# Patient Record
Sex: Male | Born: 1970 | Race: Black or African American | Hispanic: No | Marital: Married | Smoking: Never smoker
Health system: Southern US, Community
[De-identification: ages and names within clinical notes are randomized; demographics above are authoritative.]

## PROBLEM LIST (undated history)

## (undated) DIAGNOSIS — R053 Chronic cough: Secondary | ICD-10-CM

## (undated) DIAGNOSIS — K219 Gastro-esophageal reflux disease without esophagitis: Secondary | ICD-10-CM

## (undated) DIAGNOSIS — R05 Cough: Secondary | ICD-10-CM

## (undated) DIAGNOSIS — Z91018 Allergy to other foods: Secondary | ICD-10-CM

## (undated) DIAGNOSIS — J45909 Unspecified asthma, uncomplicated: Secondary | ICD-10-CM

## (undated) HISTORY — PX: HERNIA REPAIR: SHX51

## (undated) HISTORY — DX: Allergy to other foods: Z91.018

## (undated) HISTORY — DX: Gastro-esophageal reflux disease without esophagitis: K21.9

---

## 2008-07-19 ENCOUNTER — Emergency Department (HOSPITAL_BASED_OUTPATIENT_CLINIC_OR_DEPARTMENT_OTHER): Admission: EM | Admit: 2008-07-19 | Discharge: 2008-07-19 | Payer: Self-pay | Admitting: Emergency Medicine

## 2008-08-06 ENCOUNTER — Emergency Department (HOSPITAL_BASED_OUTPATIENT_CLINIC_OR_DEPARTMENT_OTHER): Admission: EM | Admit: 2008-08-06 | Discharge: 2008-08-06 | Payer: Self-pay | Admitting: Emergency Medicine

## 2017-03-02 ENCOUNTER — Emergency Department (HOSPITAL_BASED_OUTPATIENT_CLINIC_OR_DEPARTMENT_OTHER)
Admission: EM | Admit: 2017-03-02 | Discharge: 2017-03-03 | Disposition: A | Discharge status: Home / Self Care | Payer: 59 | Attending: Emergency Medicine | Admitting: Emergency Medicine

## 2017-03-02 ENCOUNTER — Encounter (HOSPITAL_BASED_OUTPATIENT_CLINIC_OR_DEPARTMENT_OTHER): Payer: Self-pay | Admitting: Emergency Medicine

## 2017-03-02 ENCOUNTER — Emergency Department (HOSPITAL_BASED_OUTPATIENT_CLINIC_OR_DEPARTMENT_OTHER): Payer: 59

## 2017-03-02 DIAGNOSIS — R059 Cough, unspecified: Secondary | ICD-10-CM

## 2017-03-02 DIAGNOSIS — R0602 Shortness of breath: Secondary | ICD-10-CM | POA: Insufficient documentation

## 2017-03-02 DIAGNOSIS — J4541 Moderate persistent asthma with (acute) exacerbation: Secondary | ICD-10-CM | POA: Diagnosis not present

## 2017-03-02 DIAGNOSIS — Z79899 Other long term (current) drug therapy: Secondary | ICD-10-CM | POA: Diagnosis not present

## 2017-03-02 DIAGNOSIS — R05 Cough: Secondary | ICD-10-CM | POA: Diagnosis present

## 2017-03-02 HISTORY — DX: Unspecified asthma, uncomplicated: J45.909

## 2017-03-02 HISTORY — DX: Chronic cough: R05.3

## 2017-03-02 HISTORY — DX: Cough: R05

## 2017-03-02 LAB — CBC WITH DIFFERENTIAL/PLATELET
BASOS PCT: 0 %
Basophils Absolute: 0 10*3/uL (ref 0.0–0.1)
Eosinophils Absolute: 0.8 10*3/uL — ABNORMAL HIGH (ref 0.0–0.7)
Eosinophils Relative: 10 %
HCT: 43 % (ref 39.0–52.0)
Hemoglobin: 14.5 g/dL (ref 13.0–17.0)
Lymphocytes Relative: 28 %
Lymphs Abs: 2.4 10*3/uL (ref 0.7–4.0)
MCH: 30.7 pg (ref 26.0–34.0)
MCHC: 33.7 g/dL (ref 30.0–36.0)
MCV: 91.1 fL (ref 78.0–100.0)
Monocytes Absolute: 0.8 10*3/uL (ref 0.1–1.0)
Monocytes Relative: 10 %
NEUTROS ABS: 4.4 10*3/uL (ref 1.7–7.7)
NEUTROS PCT: 52 %
Platelets: 184 10*3/uL (ref 150–400)
RBC: 4.72 MIL/uL (ref 4.22–5.81)
RDW: 13.6 % (ref 11.5–15.5)
WBC: 8.5 10*3/uL (ref 4.0–10.5)

## 2017-03-02 LAB — BASIC METABOLIC PANEL
ANION GAP: 10 (ref 5–15)
BUN: 15 mg/dL (ref 6–20)
CALCIUM: 9.4 mg/dL (ref 8.9–10.3)
CO2: 25 mmol/L (ref 22–32)
Chloride: 105 mmol/L (ref 101–111)
Creatinine, Ser: 1.04 mg/dL (ref 0.61–1.24)
Glucose, Bld: 117 mg/dL — ABNORMAL HIGH (ref 65–99)
POTASSIUM: 3.7 mmol/L (ref 3.5–5.1)
Sodium: 140 mmol/L (ref 135–145)

## 2017-03-02 LAB — TROPONIN I

## 2017-03-02 MED ORDER — IPRATROPIUM-ALBUTEROL 0.5-2.5 (3) MG/3ML IN SOLN
3.0000 mL | Freq: Four times a day (QID) | RESPIRATORY_TRACT | Status: DC
  Administered 2017-03-02: 3 mL via RESPIRATORY_TRACT
  Filled 2017-03-02: qty 3

## 2017-03-02 NOTE — ED Triage Notes (Addendum)
c/o cough, SOB x 2 months-states he has been seen urgent care for same x 4-pt with continuous cough in triage

## 2017-03-02 NOTE — ED Provider Notes (Signed)
[facility] Provider Note   CSN: [id number] Arrival date & time: 03/02/17  2216  By signing my name below, I, Modena Jansky, attest that this documentation has been prepared under the direction and in the presence of Geoffery Lyons, MD. Electronically Signed: Modena Jansky, Scribe. 03/02/2017. 11:00 PM.  History   Chief Complaint Chief Complaint  Patient presents with  . Cough   The history is provided by the patient. No language interpreter was used.  Cough  This is a new problem. The current episode started more than 1 week ago. The problem occurs constantly. The problem has been gradually worsening. The cough is productive of sputum. There has been no fever. Associated symptoms include shortness of breath. Treatments tried: inhaler. The treatment provided no relief. He is not a smoker. His past medical history is significant for bronchitis. His past medical history does not include asthma.   HPI Comments: Ricky Gutierrez is a 46 y.o. male who presents to the Emergency Department complaining of intermittent moderate productive cough that started about 4 months ago. He reports that today is the 4th evaluation for the same complaint. He was diagnosed with bronchitis with this complaint. Prior to this episode, he was only on allergy medication but has been prescribed albuterol inhaler, antibiotics, and prednisone in the past 4 months. He had a coughing fit 3 weeks ago when he had a brief episode of LOC. He came to the ED today due to worsening progression. His cough is unrelieved by his inhaler. He reports associated intermittent lightheadedness and SOB with coughing spells. Denies any prior hx of asthma, hx of smoking, or other complaints at this time.  Past Medical History:  Diagnosis Date  . Chronic cough   . Reactive airway disease     There are no active problems to display for this patient.   Past Surgical History:  Procedure Laterality Date  . HERNIA REPAIR          Home Medications    Prior to Admission medications   Medication Sig Start Date End Date Taking? Authorizing Provider  Albuterol Sulfate (PROAIR HFA IN) Inhale into the lungs.   Yes Historical Provider, MD  AMLODIPINE BESYLATE PO Take by mouth.   Yes Historical Provider, MD  BENZONATATE PO Take by mouth.   Yes Historical Provider, MD  Fexofenadine HCl (ALLEGRA PO) Take by mouth.   Yes Historical Provider, MD  Montelukast Sodium (SINGULAIR PO) Take by mouth.   Yes Historical Provider, MD  OMEPRAZOLE PO Take by mouth.   Yes Historical Provider, MD  triamcinolone (NASACORT) 55 MCG/ACT AERO nasal inhaler Place 2 sprays into the nose daily.   Yes Historical Provider, MD    Family History No family history on file.  Social History Social History  Substance Use Topics  . Smoking status: Never Smoker  . Smokeless tobacco: Never Used  . Alcohol use No     Allergies   Fish allergy   Review of Systems Review of Systems  Respiratory: Positive for cough and shortness of breath.   Neurological: Positive for syncope (Brief) and light-headedness.  All other systems reviewed and are negative.    Physical Exam Updated Vital Signs BP 128/86 (BP Location: Left Arm)   Pulse 98   Temp 99.2 F (37.3 C) (Oral)   Resp 20   Ht  (1.803 m)   Wt (!) 305 lb (138.3 kg)   SpO2 99%   BMI 42.54 kg/m   Physical Exam  Constitutional: He  is oriented to person, place, and time. He appears well-developed and well-nourished.  HENT:  Head: Normocephalic and atraumatic.  Eyes: EOM are normal.  Neck: Normal range of motion.  Cardiovascular: Normal rate, regular rhythm, normal heart sounds and intact distal pulses.   Pulmonary/Chest: Effort normal. No respiratory distress. He has wheezes. He has no rales.  Expiratory rhonchi bilaterally.   Abdominal: Soft. He exhibits no distension. There is no tenderness.  Musculoskeletal: Normal range of motion.  Neurological: He is alert and  oriented to person, place, and time.  Skin: Skin is warm and dry.  Psychiatric: He has a normal mood and affect. Judgment normal.  Nursing note and vitals reviewed.    ED Treatments / Results  DIAGNOSTIC STUDIES: Oxygen Saturation is 99% on RA, normal by my interpretation.    COORDINATION OF CARE: 11:05 PM- Pt advised of plan for treatment and pt agrees.  Labs (all labs ordered are listed, but only abnormal results are displayed) Labs Reviewed  BASIC METABOLIC PANEL - Abnormal; Notable for the following:       Result Value   Glucose, Bld 117 (*)    All other components within normal limits  CBC WITH DIFFERENTIAL/PLATELET - Abnormal; Notable for the following:    Eosinophils Absolute 0.8 (*)    All other components within normal limits  TROPONIN I  BRAIN NATRIURETIC PEPTIDE    EKG  EKG Interpretation None       Radiology Dg Chest 2 View  Result Date: 03/02/2017 CLINICAL DATA:  Cough and dyspnea for 1 month, worsened tonight. EXAM: CHEST  2 VIEW COMPARISON:  02/09/2017 FINDINGS: The lungs are clear. The pulmonary vasculature is normal. Heart size is normal. Hilar and mediastinal contours are unremarkable. There is no pleural effusion. IMPRESSION: No active cardiopulmonary disease. Electronically Signed   By: Ellery Plunk M.D.   On: 03/02/2017 23:47    Procedures Procedures (including critical care time)  Medications Ordered in ED Medications  ipratropium-albuterol (DUONEB) 0.5-2.5 (3) MG/3ML nebulizer solution 3 mL (3 mLs Nebulization Given 03/02/17 2336)     Initial Impression / Assessment and Plan / ED Course  I have reviewed the triage vital signs and the nursing notes.  Pertinent labs & imaging results that were available during my care of the patient were reviewed by me and considered in my medical decision making (see chart for details).  Patient is a 46 year old male with past medical history of what sounds like newly diagnosed asthma. He has had  persistent cough for the past 2 months. He has been in urgent care on multiple occasions, however medications he has been given have not been effective.  He is wheezing somewhat on presentation. He was given an hour-long treatment along with a dose of an IV steroid. His workup today reveals no evidence for a cardiac etiology. His EKG is essentially normal and troponin is negative. BNP is normal and there is no evidence for heart failure. This appears to be some sort of reactive airway disease. He will be given another round of steroids and is to follow-up with pulmonology.  Final Clinical Impressions(s) / ED Diagnoses   Final diagnoses:  None    New Prescriptions New Prescriptions   No medications on file   I personally performed the services described in this documentation, which was scribed in my presence. The recorded information has been reviewed and is accurate.        Geoffery Lyons, MD 03/03/17 [phone]

## 2017-03-03 LAB — BRAIN NATRIURETIC PEPTIDE: B NATRIURETIC PEPTIDE 5: 7.7 pg/mL (ref 0.0–100.0)

## 2017-03-03 MED ORDER — METHYLPREDNISOLONE SODIUM SUCC 125 MG IJ SOLR
125.0000 mg | Freq: Once | INTRAMUSCULAR | Status: AC
  Administered 2017-03-03: 125 mg via INTRAVENOUS
  Filled 2017-03-03: qty 2

## 2017-03-03 MED ORDER — HYDROCODONE-ACETAMINOPHEN 7.5-325 MG/15ML PO SOLN: 10.0000 mL | Freq: Four times a day (QID) | ORAL | 0 refills | Status: DC | PRN

## 2017-03-03 MED ORDER — HYDROCODONE-HOMATROPINE 5-1.5 MG/5ML PO SYRP: 5.0000 mL | ORAL_SOLUTION | Freq: Four times a day (QID) | ORAL | 0 refills | Status: DC | PRN

## 2017-03-03 MED ORDER — PREDNISONE 10 MG PO TABS: 20.0000 mg | ORAL_TABLET | Freq: Two times a day (BID) | ORAL | 0 refills | Status: DC

## 2017-03-03 MED ORDER — ALBUTEROL (5 MG/ML) CONTINUOUS INHALATION SOLN
15.0000 mg/h | INHALATION_SOLUTION | RESPIRATORY_TRACT | Status: DC
  Administered 2017-03-03: 15 mg/h via RESPIRATORY_TRACT

## 2017-03-03 MED ORDER — IPRATROPIUM BROMIDE 0.02 % IN SOLN
0.5000 mg | Freq: Once | RESPIRATORY_TRACT | Status: AC
  Administered 2017-03-03: 0.5 mg via RESPIRATORY_TRACT
  Filled 2017-03-03: qty 2.5

## 2017-03-03 MED ORDER — HYDROCODONE-ACETAMINOPHEN 7.5-325 MG/15ML PO SOLN
15.0000 mL | Freq: Once | ORAL | Status: AC
  Administered 2017-03-03: 15 mL via ORAL
  Filled 2017-03-03: qty 15

## 2017-03-03 NOTE — ED Notes (Signed)
Pt verbalizes understanding of d/c instructions and denies any further needs at this time. 

## 2017-03-03 NOTE — Discharge Instructions (Signed)
Prednisone as prescribed.  Follow-up with pulmonology in the next week, the number for the limp or pulmonology clinic has been provided in this discharge summary for you to call and make these arrangements.

## 2017-03-08 ENCOUNTER — Ambulatory Visit (INDEPENDENT_AMBULATORY_CARE_PROVIDER_SITE_OTHER): Payer: 59 | Admitting: Internal Medicine

## 2017-03-08 ENCOUNTER — Encounter: Payer: Self-pay | Admitting: Internal Medicine

## 2017-03-08 VITALS — BP 142/86 | HR 70 | Ht 71.0 in | Wt 310.0 lb

## 2017-03-08 DIAGNOSIS — J45991 Cough variant asthma: Secondary | ICD-10-CM

## 2017-03-08 MED ORDER — FAMOTIDINE 20 MG PO TABS: ORAL_TABLET | ORAL | 2 refills | Status: DC

## 2017-03-08 NOTE — Progress Notes (Signed)
Subjective:     Patient ID: Ricky Gutierrez, male   DOB: 10-07-1971,   MRN: [id number]  HPI  24  yobm never smoker grew up [place] good athlete no respiratory problems until around 2013 rhinitis then around 2016 with cough/ sob esp drinking cold liquids or sleeping R side much worse starting March 2018 > UC x 3 > much better while on prednisone than when finished so referred to pulmonary clinic 03/08/2017 by [facility]   03/08/2017 1st [place] [facility] office visit/ Gloria Ricardo  On singulair  Chief Complaint  Patient presents with  . Pulmonary Consult    Referred by Dr. Geoffery Lyons.  Pt c/o SOB for the past 2 yrs. He c/o cough for 2 months- occ prod with gold sputum.  He states he gets SOB with and without exertion. He feels worse if he lies on his right side. He states he wakes up in the night coughing. He has also noticed that cold liquids make his cough worse.   despite prednisone/ abx and feeling "better" still has sensation of something stuck in throat but very little actual mucus  production pred works much  Better than albuterol  - symptoms worse at hs / was waking him up but not now on [facility]codan [facility] with overt Hb but no active sinus symptoms  No better with tessilon or delsym     No obvious day to day or daytime variability or assoc excess/ purulent sputum or mucus plugs or hemoptysis or cp or chest tightness, subjective wheeze . No unusual exp hx or h/o childhood pna/ asthma or knowledge of premature birth.  Sleeping ok without nocturnal  or early am exacerbation  of respiratory  c/o's or need for noct saba. Also denies any obvious fluctuation of symptoms with weather or environmental changes or other aggravating or alleviating factors except as outlined above   Current Medications, Allergies, Complete Past Medical History, Past Surgical History, Family History, and Social History were reviewed in [employer] record.  ROS  The following are not active  complaints unless bolded sore throat, dysphagia, dental problems, itching, sneezing,  nasal congestion or excess/ purulent secretions, ear ache,   fever, chills, sweats, unintended wt loss, classically pleuritic or exertional cp,  orthopnea pnd or leg swelling, presyncope, palpitations, abdominal pain, anorexia, nausea, vomiting, diarrhea  or change in bowel or bladder habits, change in stools or urine, dysuria,hematuria,  rash, arthralgias, visual complaints, headache, numbness, weakness or ataxia or problems with walking or coordination,  change in mood/affect or memory.            Review of Systems     Objective:   Physical Exam    amb obese bm nad/ vigorous throat clearing  Wt Readings from Last 3 Encounters:  03/08/17 (!) 310 lb (140.6 kg)  03/02/17 (!) 305 lb (138.3 kg)    Vital signs reviewed  - Note on arrival 02 sats  97% on RA     HEENT: nl dentition,  and oropharynx. Wax in both external ear canals without cough reflex - mild  bilateral non-specific turbinate edema  With very min  mucopurulent mucus   NECK :  without JVD/Nodes/TM/ nl carotid upstrokes bilaterally   LUNGS: no acc muscle use,  Nl contour chest which is clear to A and P bilaterally without cough on insp or exp maneuvers   CV:  RRR  no s3 or murmur or increase in P2, and no edema   ABD:  soft and nontender with nl inspiratory excursion in the supine position. No bruits or organomegaly appreciated, bowel sounds nl  MS:  Nl gait/ ext warm without deformities, calf tenderness, cyanosis or clubbing No obvious joint restrictions   SKIN: warm and dry without lesions    NEURO:  alert, approp, nl sensorium with  no motor or cerebellar deficits apparent.      I personally reviewed images and agree with radiology impression as follows:  CXR:   03/02/17 No active cardiopulmonary disease.  Labs reviewed:  Cbc 03/02/17 with Eos  0.8  Assessment:

## 2017-03-08 NOTE — Patient Instructions (Addendum)
Please see patient coordinator before you leave today  to schedule sinus CT asap   Omeprazole  40 mg   Take  30-60 min before first meal of the day and Pepcid (famotidine)  20 mg one @  bedtime until return to office - this is the best way to tell whether stomach acid is contributing to your problem.    For drainage / throat tickle try take CHLORPHENIRAMINE  4 mg - take one every 4 hours as needed - available over the counter- may cause drowsiness so start with just a bedtime dose or two and see how you tolerate it before trying in daytime    GERD (REFLUX)  is an extremely common cause of respiratory symptoms just like yours , many times with no obvious heartburn at all.    It can be treated with medication, but also with lifestyle changes including elevation of the head of your bed (ideally with 6 inch  bed blocks),  Smoking cessation, avoidance of late meals, excessive alcohol, and avoid fatty foods, chocolate, peppermint, colas, red wine, and acidic juices such as orange juice.  NO MINT OR MENTHOL PRODUCTS SO NO COUGH DROPS   USE SUGARLESS CANDY INSTEAD (Jolley ranchers or Stover's or Life Savers) or even ice chips will also do - the key is to swallow to prevent all throat clearing. NO OIL BASED VITAMINS - use powdered substitutes.   Please schedule a follow up office visit in 2  weeks, sooner if needed  with all medications /inhalers/ solutions in hand so we can verify exactly what you are taking. This includes all medications from all doctors and over the counters - if not improved send allergy profile/ consider trial of gabapentin

## 2017-03-08 NOTE — Assessment & Plan Note (Signed)
Body mass index is 43.24 kg/m.  -  trending up No results found for: TSH   Contributing to gerd risk/ doe/reviewed the need and the process to achieve and maintain neg calorie balance > defer f/u primary care including intermittently monitoring thyroid status

## 2017-03-08 NOTE — Assessment & Plan Note (Addendum)
Spirometry 03/08/2017  No obst on last day of pred taper   - Sinus CT 03/08/2017 >>  With h/o Eosinophilia and prev h/o ? Atopic rhinitis tempting to make a dx of cough variant asthma here but much more likely this is Upper airway cough syndrome (previously labeled PNDS) , is  so named because it's frequently impossible to sort out how much is  CR/sinusitis with freq throat clearing (which can be related to primary GERD)   vs  causing  secondary (" extra esophageal")  GERD from wide swings in gastric pressure that occur with throat clearing, often  promoting self use of mint and menthol lozenges that reduce the lower esophageal sphincter tone and exacerbate the problem further in a cyclical fashion.   These are the same pts (now being labeled as having "irritable larynx syndrome" by some cough centers) who not infrequently have a history of having failed to tolerate ace inhibitors,  dry powder inhalers or biphosphonates or report having atypical/extraesophageal reflux symptoms that don't respond to standard doses of PPI  and are easily confused as having aecopd or asthma flares by even experienced allergists/ pulmonologists (myself included).  Of the three most common causes of  Sub-acute or recurrent or chronic cough, only one (GERD)  can actually contribute to/ trigger  the other two (asthma and post nasal drip syndrome)  and perpetuate the cylce of cough.  While not intuitively obvious, many patients with chronic low grade reflux do not cough until there is a primary insult that disturbs the protective epithelial barrier and exposes sensitive nerve endings.   This is typically viral but can be direct physical injury such as with an endotracheal tube.   The point is that once this occurs, it is difficult to eliminate the cycle  using anything but a maximally effective acid suppression regimen at least in the short run, accompanied by an appropriate diet to address non acid GERD and control / eliminate the  cough itself for at least 3 days.   Therefore for now will check sinus CT but hold further abx and max rx for gerd/ pnds with 1st gen H1 as per guidelines >>>   Then regroup in 2 weeks with all meds in hand using a trust but verify approach to confirm accurate Medication  Reconciliation The principal here is that until we are certain that the  patients are doing what we've asked, it makes no sense to ask them to do more.   Advised pt and wife:  The standardized cough guidelines published in Chest by [employer] in 2006 are still the best available and consist of a multiple step process (up to 12!) , not a single office visit,  and are intended  to address this problem logically,  with an alogrithm dependent on response to empiric treatment at  each progressive step  to determine a specific diagnosis with  minimal addtional testing needed. Therefore if adherence is an issue or can't be accurately verified,  it's very unlikely the standard evaluation and treatment will be successful here.    Furthermore, response to therapy (other than acute cough suppression, which should only be used short term with avoidance of narcotic containing cough syrups if possible), can be a gradual process for which the patient is not likely to  perceive immediate benefit.  Unlike going to an eye doctor where the best perscription is almost always the first one and is immediately effective, this is almost never the case in the management of  chronic cough syndromes. Therefore the patient needs to commit up front to consistently adhere to recommendations  for up to 6 weeks of therapy directed at the likely underlying problem(s) before the response can be reasonably evaluated.    Total time devoted to counseling  > 50 % of initial 60 min office visit:  review case with pt/ discussion of options/alternatives/ personally creating written customized instructions  in presence of pt  then going over those specific  Instructions  directly with the pt including how to use all of the meds but in particular covering each new medication in detail and the difference between the maintenance= "automatic" meds and the prns using an action plan format for the latter (If this problem/symptom => do that organization reading Left to right).  Please see AVS from this visit for a full list of these instructions which I personally wrote for this pt and  are unique to this visit.

## 2017-03-12 ENCOUNTER — Ambulatory Visit (HOSPITAL_BASED_OUTPATIENT_CLINIC_OR_DEPARTMENT_OTHER): Payer: 59

## 2017-03-26 ENCOUNTER — Ambulatory Visit (INDEPENDENT_AMBULATORY_CARE_PROVIDER_SITE_OTHER): Payer: 59 | Admitting: Internal Medicine

## 2017-03-26 ENCOUNTER — Encounter: Payer: Self-pay | Admitting: Internal Medicine

## 2017-03-26 ENCOUNTER — Other Ambulatory Visit (INDEPENDENT_AMBULATORY_CARE_PROVIDER_SITE_OTHER): Payer: 59

## 2017-03-26 VITALS — BP 120/80 | HR 74 | Ht 71.0 in | Wt 306.0 lb

## 2017-03-26 DIAGNOSIS — J45991 Cough variant asthma: Secondary | ICD-10-CM | POA: Diagnosis not present

## 2017-03-26 LAB — CBC WITH DIFFERENTIAL/PLATELET
Basophils Absolute: 0 10*3/uL (ref 0.0–0.1)
Basophils Relative: 0.8 % (ref 0.0–3.0)
EOS PCT: 7.5 % — AB (ref 0.0–5.0)
Eosinophils Absolute: 0.4 10*3/uL (ref 0.0–0.7)
HEMATOCRIT: 42 % (ref 39.0–52.0)
HEMOGLOBIN: 14.3 g/dL (ref 13.0–17.0)
LYMPHS ABS: 1.5 10*3/uL (ref 0.7–4.0)
LYMPHS PCT: 27.3 % (ref 12.0–46.0)
MCHC: 34 g/dL (ref 30.0–36.0)
MCV: 89.9 fl (ref 78.0–100.0)
MONOS PCT: 10.3 % (ref 3.0–12.0)
Monocytes Absolute: 0.6 10*3/uL (ref 0.1–1.0)
NEUTROS PCT: 54.1 % (ref 43.0–77.0)
Neutro Abs: 3 10*3/uL (ref 1.4–7.7)
Platelets: 190 10*3/uL (ref 150.0–400.0)
RBC: 4.67 Mil/uL (ref 4.22–5.81)
RDW: 14.3 % (ref 11.5–15.5)
WBC: 5.5 10*3/uL (ref 4.0–10.5)

## 2017-03-26 MED ORDER — GABAPENTIN 100 MG PO CAPS: 100.0000 mg | ORAL_CAPSULE | Freq: Three times a day (TID) | ORAL | 2 refills | Status: DC

## 2017-03-26 NOTE — Patient Instructions (Addendum)
Gabapentin 100 mg three times daily    For drainage / throat tickle try take CHLORPHENIRAMINE  4 mg - take one every 4 hours as needed - available over the counter- may cause drowsiness so start with just a bedtime dose or two and see how you tolerate it before trying in daytime    Life savers and jolly ranchers and stovers all come in sugarless versions    Please remember to go to the lab department downstairs in the basement  for your tests - we will call you with the results when they are available.  Please schedule a follow up office visit in 4 weeks, sooner if needed

## 2017-03-26 NOTE — Progress Notes (Signed)
Subjective:     Patient ID: Ricky KaufmanChristopher E Gutierrez, male   DOB: Mar 06, 1971,   MRN: [id number]    Brief patient profile:  46  yobm never smoker grew up [place] good athlete no respiratory problems until around 2013 rhinitis then around 2016 with cough/ sob esp drinking cold liquids or sleeping R side much worse starting March 2018 > UC x 3 > much better while on prednisone than p finished it so referred to pulmonary clinic 03/08/2017 by [facility]    History of Present Illness  03/08/2017 1st [place] [facility] office visit/ Nolyn Eilert  On singulair  Chief Complaint  Patient presents with  . Pulmonary Consult    Referred by Dr. Geoffery Lyonselo Douglas.  Pt c/o SOB for the past 2 yrs. He c/o cough for 2 months- occ prod with gold sputum.  He states he gets SOB with and without exertion. He feels worse if he lies on his right side. He states he wakes up in the night coughing. He has also noticed that cold liquids make his cough worse.   despite prednisone/ abx and feeling "better" still has sensation of something stuck in throat but very little actual mucus  production pred works much  Better than albuterol  - symptoms worse at hs / was waking him up but not now on [facility]codan [facility] with overt Hb but no active sinus symptoms  No better with tessilon or delsym  rec Please see patient coordinator before you leave today  to schedule sinus CT asap > too expensive/ canceled  Omeprazole  40 mg   Take  30-60 min before first meal of the day and Pepcid (famotidine)  20 mg one @  bedtime until return to office - this is the best way to tell whether stomach acid is contributing to your problem.   For drainage / throat tickle try take CHLORPHENIRAMINE  4 mg - take one every 4 hours as needed  GERD > did not take as rec  Please schedule a follow up office visit in 2  weeks, sooner if needed  with all medications /inhalers/ solutions in hand so we can verify exactly what you are taking. This includes all medications from  all doctors and over the counters - if not improved send allergy profile/ consider trial of gabapentin     03/26/2017  f/u ov/Jaelle Campanile re:  Cough since 2016 rx  maint rx ppi/gerd  Chief Complaint  Patient presents with  . Follow-up    Cough has improved some, but still bothers him on occ.    main concern is "something stuck in throat"  But only aware of it during the day, not noct.   Not limited by breathing from desired activities  / minimal albuterol off singulair since last ov / denies nasal symptoms  No obvious day to day or daytime variability or assoc excess/ purulent sputum or mucus plugs or hemoptysis or cp or chest tightness, subjective wheeze or overt sinus or hb symptoms. No unusual exp hx or h/o childhood pna/ asthma or knowledge of premature birth.  Sleeping ok without nocturnal  or early am exacerbation  of respiratory  c/o's or need for noct saba. Also denies any obvious fluctuation of symptoms with weather or environmental changes or other aggravating or alleviating factors except as outlined above   Current Medications, Allergies, Complete Past Medical History, Past Surgical History, Family History, and Social History were reviewed in [facility] electronic medical record.  ROS  The  following are not active complaints unless bolded sore throat, dysphagia, dental problems, itching, sneezing,  nasal congestion or excess/ purulent secretions, ear ache,   fever, chills, sweats, unintended wt loss, classically pleuritic or exertional cp,  orthopnea pnd or leg swelling, presyncope, palpitations, abdominal pain, anorexia, nausea, vomiting, diarrhea  or change in bowel or bladder habits, change in stools or urine, dysuria,hematuria,  rash, arthralgias, visual complaints, headache, numbness, weakness or ataxia or problems with walking or coordination,  change in mood/affect or memory.                  Objective:   Physical Exam    amb obese bm nad    03/26/2017        306    03/08/17 (!) 310 lb (140.6 kg)  03/02/17 (!) 305 lb (138.3 kg)    Vital signs reviewed  - Note on arrival 02 sats  96% on RA     HEENT: nl dentition,  and oropharynx.   - mild  bilateral non-specific turbinate edema         NECK :  without JVD/Nodes/TM/ nl carotid upstrokes bilaterally   LUNGS: no acc muscle use,  Nl contour chest which is clear to A and P bilaterally without cough on insp or exp maneuvers   CV:  RRR  no s3 or murmur or increase in P2, and no edema   ABD:  soft and nontender with nl inspiratory excursion in the supine position. No bruits or organomegaly appreciated, bowel sounds nl  MS:  Nl gait/ ext warm without deformities, calf tenderness, cyanosis or clubbing No obvious joint restrictions   SKIN: warm and dry without lesions    NEURO:  alert, approp, nl sensorium with  no motor or cerebellar deficits apparent.      I personally reviewed images and agree with radiology impression as follows:  CXR:   03/02/17 No active cardiopulmonary disease.   Labs ordered 03/26/2017  Allergy profile     Assessment:

## 2017-03-28 NOTE — Assessment & Plan Note (Signed)
Body mass index is 42.68 kg/m.  -  trending down slightly/ encouraged  No results found for: TSH   Contributing to gerd risk/ doe/reviewed the need and the process to achieve and maintain neg calorie balance > defer f/u primary care including intermittently monitoring thyroid status

## 2017-03-28 NOTE — Assessment & Plan Note (Addendum)
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   These are the same pts (now being labeled as having "irritable larynx syndrome" by some cough centers) who not infrequently have a history of having failed to tolerate ace inhibitors,  dry powder inhalers or biphosphonates or report having atypical/extraesophageal reflux symptoms that don't respond to standard doses of PPI  and are easily confused as having aecopd or asthma flares by even experienced allergists/ pulmonologists (myself included).  rec allergy profile, 1st gen h1 per guidelines, and trial of gabapentin in low doses  I had an extended discussion with the patient reviewing all relevant studies completed to date and  lasting 15 to 20 minutes of a 25 minute visit    Each maintenance medication was reviewed in detail including most importantly the difference between maintenance and prns and under what circumstances the prns are to be triggered using an action plan format that is not reflected in the computer generated alphabetically organized AVS.    Please see AVS for specific instructions unique to this visit that I personally wrote and verbalized to the the pt in detail and then reviewed with pt  by my nurse highlighting any  changes in therapy recommended at today's visit to their plan of care.

## 2017-03-30 LAB — RESPIRATORY ALLERGY PROFILE REGION II ~~LOC~~
ALLERGEN, A. ALTERNATA, M6: 1.47 kU/L — AB
ALLERGEN, COMM SILVER BIRCH, T3: 23.1 kU/L — AB
ALLERGEN, D PTERNOYSSINUS, D1: 0.24 kU/L — AB
ASPERGILLUS FUMIGATUS M3: 0.49 kU/L — AB
Allergen, C. Herbarum, M2: 2.17 kU/L — ABNORMAL HIGH
Allergen, Cedar tree, t12: 2.01 kU/L — ABNORMAL HIGH
Allergen, Cottonwood, t14: <0.1 kU/L
Allergen, Mouse Urine Protein, e78: 0.12 kU/L — ABNORMAL HIGH
Allergen, Oak,t7: 20.2 kU/L — ABNORMAL HIGH
Bermuda Grass: 0.18 kU/L — ABNORMAL HIGH
Box Elder IgE: 2.03 kU/L — ABNORMAL HIGH
COCKROACH: 0.21 kU/L — AB
COMMON RAGWEED: 0.48 kU/L — AB
Cat Dander: 1.68 kU/L — ABNORMAL HIGH
D. FARINAE: 0.16 kU/L — AB
DOG DANDER: 9.46 kU/L — AB
ELM IGE: 0.11 kU/L — AB
IGE (IMMUNOGLOBULIN E), SERUM: 422 kU/L — AB (ref <115)
JOHNSON GRASS: 1.04 kU/L — AB
Pecan/Hickory Tree IgE: 8.78 kU/L — ABNORMAL HIGH
SHEEP SORREL IGE: 0.1 kU/L — AB
Timothy Grass: 6.24 kU/L — ABNORMAL HIGH

## 2017-03-31 NOTE — Progress Notes (Signed)
[facility]

## 2017-04-06 NOTE — Progress Notes (Signed)
LMTCB

## 2017-04-09 ENCOUNTER — Telehealth: Payer: Self-pay | Admitting: Internal Medicine

## 2017-04-09 DIAGNOSIS — J45991 Cough variant asthma: Secondary | ICD-10-CM

## 2017-04-09 NOTE — Telephone Encounter (Signed)
LMTCB

## 2017-04-09 NOTE — Telephone Encounter (Signed)
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

## 2017-04-09 NOTE — Progress Notes (Signed)
LMTCB

## 2017-04-12 NOTE — Telephone Encounter (Signed)
Spoke with pt, aware of results/recs.  Referral placed, copy of labs sent to verified home address at pt's request.  Nothing further needed.

## 2017-04-23 ENCOUNTER — Encounter: Payer: Self-pay | Admitting: Internal Medicine

## 2017-04-23 ENCOUNTER — Ambulatory Visit (INDEPENDENT_AMBULATORY_CARE_PROVIDER_SITE_OTHER): Payer: 59 | Admitting: Internal Medicine

## 2017-04-23 VITALS — BP 118/82 | HR 82 | Ht 71.0 in | Wt 313.0 lb

## 2017-04-23 DIAGNOSIS — J45991 Cough variant asthma: Secondary | ICD-10-CM | POA: Diagnosis not present

## 2017-04-23 MED ORDER — PREDNISONE 10 MG PO TABS: ORAL_TABLET | ORAL | 0 refills | Status: DC

## 2017-04-23 NOTE — Assessment & Plan Note (Signed)
Spirometry 03/08/2017  No obst on last day of pred taper   - Sinus CT 03/08/2017 > declined due to cost - Allergy profile 03/26/2017 >  Eos 0.4 /  IgE  422 RAST pos to virtually everything > referred to allergy  - Gabapentin trial 03/26/2017 >>> improved 04/23/2017 @ 100 mg tid > continue  - Spirometry 04/23/2017  Off saba and pred > no obst   I had an extended final summary discussion with the patient reviewing all relevant studies completed to date and  lasting 15 to 20 minutes of a 25 minute visit on the following issues:     Not really clear how much gerd is contributing to symptoms which seem more allergic albeit mostly upper airway and not cough variant asthma in nature based on response to gabapentin when all else failed   Still needs to complete the w/u with Dr Lucie [facility] as planned but ok to stop acid suppression trial basis in meantime and restart if worse and if still not satisfied repeat the Prednisone 10 mg take  4 each am x 2 days,   2 each am x 2 days,  1 each am x 2 days and stop which worked in [facility]/ consider [facility]ulair per [facility]  Each maintenance medication was reviewed in detail including most importantly the difference between maintenance and as needed and under what circumstances the prns are to be used.  Please see AVS for specific  Instructions which are unique to this visit and I personally typed out  which were reviewed in detail in writing with the patient and a copy provided.

## 2017-04-23 NOTE — Assessment & Plan Note (Signed)
Body mass index is 43.65 kg/m.  -  trending up  No results found for: TSH   Contributing to gerd risk/ doe/reviewed the need and the process to achieve and maintain neg calorie balance > defer f/u primary care including intermittently monitoring thyroid status

## 2017-04-23 NOTE — Patient Instructions (Addendum)
Try off reflux medications and let Dr Lucie Leather[facility]ozlow know if you notice any change - if significant then restart both prilosec and pepcid as before   For drainage / throat tickle try take CHLORPHENIRAMINE  4 mg - take one every 4 hours as needed - available over the counter- may cause drowsiness so start with just a bedtime dose or two and see how you tolerate it before trying in daytime    If symptoms worsen between now and your allergy eval:  Prednisone 10 mg take  4 each am x 2 days,   2 each am x 2 days,  1 each am x 2 days and stop   Pulmonary follow up is as needed

## 2017-04-23 NOTE — Progress Notes (Signed)
Subjective:     Patient ID: Ricky Gutierrez, male   DOB: [birth year],   MRN: [id number]    Brief patient profile:  46  yobm never smoker grew up [place] good athlete no respiratory problems until around 2013 rhinitis then around 2016 with cough/ sob esp drinking cold liquids or sleeping R side much worse starting March 2018 > UC x 3 > much better while on prednisone than p finished it so [facility] pulmonary clinic [facility]     History of Present Illness  03/08/2017 1st [facility] office visit/ Ricky Gutierrez  On singulair  Chief Complaint  Patient presents with  . Pulmonary Consult    Referred by Dr. Geoffery Lyonselo Gutierrez.  Pt c/o SOB for the past 2 yrs. He c/o cough for 2 months- occ prod with gold sputum.  He states he gets SOB with and without exertion. He feels worse if he lies on his right side. He states he wakes up in the night coughing. He has also noticed that cold liquids make his cough worse.   despite prednisone/ abx and feeling "better" still has sensation of something stuck in throat but very little actual mucus  production pred works much  Better than albuterol  - symptoms worse at hs / was waking him up but not now on hycodan cough syrup [facility] with overt Hb but no active sinus symptoms  No better with tessilon or delsym  rec Please see patient coordinator before you leave today  to schedule sinus CT asap > too expensive/ canceled  Omeprazole  40 mg   Take  30-60 min before first meal of the day and Pepcid (famotidine)  20 mg one @  bedtime until return to office - this is the best way to tell whether stomach acid is contributing to your problem.   For drainage / throat tickle try take CHLORPHENIRAMINE  4 mg - take one every 4 hours as needed  GERD > did not take as rec  Please schedule a follow up office visit in 2  weeks, sooner if needed  with all medications /inhalers/ solutions in hand so we can verify exactly what you are taking. This includes all medications from  all doctors and over the counters - if not improved send allergy profile/ consider trial of gabapentin     03/26/2017  f/u ov/Ricky Gutierrez re:  Cough since 2016 rx  maint rx ppi/gerd  Chief Complaint  Patient presents with  . Follow-up    Cough has improved some, but still bothers him on occ.    main concern is "something stuck in throat"  But only aware of it during the day, not noct.  Not limited by breathing from desired activities  / minimal albuterol off singulair since last ov / denies nasal symptoms rec Gabapentin 100 mg three times daily  For drainage / throat tickle try take CHLORPHENIRAMINE  4 mg - take one every 4 hours as needed - available over the counter- may cause drowsiness so start with just a bedtime dose or two and see how you tolerate it before trying in daytime   Life savers and jolly ranchers and stovers all come in sugarless versions  Please remember to go to the lab department > POS ATOPY> [facility]    04/23/2017  f/u ov/Ricky Gutierrez re:  ? Cough variant asthma vs uacs/  allergic rhinitis  maint on gabapentin/ gerd rx/ 1st gen h1  Chief Complaint  Patient presents with  .  Follow-up    Cough continues to improve.   sleeps fine /  Some wheeze one day prior to OV  After stuck in a car for sev hours with his dog/ otherwise doing much better  No longer needing cough meds or saba   No obvious day to day or daytime variability or assoc excess/ purulent sputum or mucus plugs or hemoptysis or cp or chest tightness, subjective wheeze or overt sinus or hb symptoms. No unusual exp hx or h/o childhood pna/ asthma or knowledge of premature birth.  Sleeping ok without nocturnal  or early am exacerbation  of respiratory  c/o's or need for noct saba. Also denies any obvious fluctuation of symptoms with weather or environmental changes or other aggravating or alleviating factors except as outlined above   Current Medications, Allergies, Complete Past Medical History, Past Surgical  History, Family History, and Social History were reviewed in [employer] record.  ROS  The following are not active complaints unless bolded sore throat, dysphagia, dental problems, itching, sneezing,  nasal congestion or excess/ purulent secretions, ear ache,   fever, chills, sweats, unintended wt loss, classically pleuritic or exertional cp,  orthopnea pnd or leg swelling, presyncope, palpitations, abdominal pain, anorexia, nausea, vomiting, diarrhea  or change in bowel or bladder habits, change in stools or urine, dysuria,hematuria,  rash, arthralgias, visual complaints, headache, numbness, weakness or ataxia or problems with walking or coordination,  change in mood/affect or memory.                    Objective:   Physical Exam    amb obese bm nad    04/23/2017         313   03/26/2017        306   03/08/17 (!) 310 lb (140.6 kg)  03/02/17 (!) 305 lb (138.3 kg)    Vital signs reviewed  - Note on arrival 02 sats  98% on RA     HEENT: nl dentition,  and oropharynx.   - mild  bilateral non-specific turbinate edema         NECK :  without JVD/Nodes/TM/ nl carotid upstrokes bilaterally   LUNGS: no acc muscle use,  Nl contour chest which is clear to A and P bilaterally without cough on insp or exp maneuvers   CV:  RRR  no s3 or murmur or increase in P2, and no edema   ABD:  soft and nontender with nl inspiratory excursion in the supine position. No bruits or organomegaly appreciated, bowel sounds nl  MS:  Nl gait/ ext warm without deformities, calf tenderness, cyanosis or clubbing No obvious joint restrictions   SKIN: warm and dry without lesions    NEURO:  alert, approp, nl sensorium with  no motor or cerebellar deficits apparent.      I personally reviewed images and agree with radiology impression as follows:  CXR:   03/02/17 No active cardiopulmonary disease.       Assessment:

## 2017-05-28 ENCOUNTER — Encounter: Payer: Self-pay | Admitting: Allergy & Immunology

## 2017-05-28 ENCOUNTER — Ambulatory Visit (INDEPENDENT_AMBULATORY_CARE_PROVIDER_SITE_OTHER): Payer: 59 | Admitting: Allergy & Immunology

## 2017-05-28 VITALS — BP 140/92 | HR 72 | Temp 98.0°F | Resp 16 | Ht 69.88 in | Wt 305.8 lb

## 2017-05-28 DIAGNOSIS — J4540 Moderate persistent asthma, uncomplicated: Secondary | ICD-10-CM

## 2017-05-28 DIAGNOSIS — K219 Gastro-esophageal reflux disease without esophagitis: Secondary | ICD-10-CM

## 2017-05-28 DIAGNOSIS — R059 Cough, unspecified: Secondary | ICD-10-CM

## 2017-05-28 DIAGNOSIS — J4550 Severe persistent asthma, uncomplicated: Secondary | ICD-10-CM

## 2017-05-28 DIAGNOSIS — J302 Other seasonal allergic rhinitis: Secondary | ICD-10-CM

## 2017-05-28 DIAGNOSIS — R05 Cough: Secondary | ICD-10-CM

## 2017-05-28 DIAGNOSIS — J3089 Other allergic rhinitis: Secondary | ICD-10-CM

## 2017-05-28 MED ORDER — LEVOCETIRIZINE DIHYDROCHLORIDE 5 MG PO TABS: ORAL_TABLET | ORAL | 5 refills | Status: DC

## 2017-05-28 MED ORDER — EPINEPHRINE 0.3 MG/0.3ML IJ SOAJ: INTRAMUSCULAR | 1 refills | Status: DC

## 2017-05-28 MED ORDER — AZELASTINE-FLUTICASONE 137-50 MCG/ACT NA SUSP: NASAL | 5 refills | Status: DC

## 2017-05-28 MED ORDER — BENRALIZUMAB 30 MG/ML ~~LOC~~ SOSY
30.0000 mg | PREFILLED_SYRINGE | SUBCUTANEOUS | Status: AC
  Administered 2017-05-28: 30 mg via SUBCUTANEOUS

## 2017-05-28 MED ORDER — FLUTICASONE FUROATE 200 MCG/ACT IN AEPB: 200.0000 ug | INHALATION_SPRAY | Freq: Every day | RESPIRATORY_TRACT | 5 refills | Status: DC

## 2017-05-28 MED ORDER — MONTELUKAST SODIUM 10 MG PO TABS: ORAL_TABLET | ORAL | 5 refills | Status: DC

## 2017-05-28 NOTE — Progress Notes (Addendum)
NEW PATIENT  Date of Service/Encounter:  05/29/17  Referring provider: Dolan Gutierrez, Dorothy, MD   Assessment:   Seasonal and perennial allergic rhinitis (trees, weeds, grasses, molds, dust mites, cat, dog, cockroach)  Gastroesophageal reflux disease - on PPI + H2 blockade  Moderate persistent asthma - not well controlled with reversibility noted today   Elevated eosinophils (absolute eosinophil count of 800)   Asthma Reportables:  Severity: moderate persistent  Risk: high Control: not well controlled   Plan/Recommendations:    1. Seasonal and perennial allergic rhinitis - Testing today showed: evidence of sensitizations to multiple other molds - This in conjunction with the blood testing confirms sensitizations to trees, weeds, grasses, molds, dust mites, cat, dog and cockroach - Avoidance measures provided. - Stop the chlorpheniramine for now and we will start a 24-hour antihistamine instead (Xyzal 5mg  daily) - Stop the Flonase.  - Start Xyzal (levocetirizine) 5mg  tablet once daily, Singulair (montelukast) 10mg  daily and Dymista (fluticasone/azelastine) two sprays per nostril 1-2 times daily as needed - You can use an extra dose of the antihistamine, if needed, for breakthrough symptoms.  - Consider nasal saline rinses 1-2 times daily to remove allergens from the nasal cavities as well as help with mucous clearance (this is especially helpful to do before the nasal sprays are given) - Consider allergy shots as a means of long-term control. - Allergy shots "re-train" the immune system to ignore environmental allergens and decrease the resulting immune response to those allergens (sneezing, itchy watery eyes, runny nose, nasal congestion, etc).   - We can discuss more at the next appointment if the medications are not working for you. - Once you change your insurance, you could check on the price of allergy shots. - Once the Harrington ChallengerFasenra has had a few months to work, we might know  whether the allergy shots are necessarily needed.  - [name] lives in [place] with his family, therefore making it to the office for shots might prove a challenge. - [place]   2. Cough - Continue with the GERD medications.  - Talk to Ricky Gutierrez about restarting the gabapentin (Neurontin) since it was not clear whether he wanted you to continue that. - We will see whether the Harrington ChallengerFasenra provides any improvement in the coughing episodes.   3. Moderate persistent asthma - Lung testing today was not normal, but you did improve with albuterol. - He was audibly wheezing on exam today, which has not been noted during visits with Ricky Gutierrez. - [name] explains today that this is the worst he has been, and at previous visits with Ricky Gutierrez he was only complaining about coughing. - The lack of wheezing at previous evaluations likely made the diagnosis less straightforward. - [name] wheezing today did improve with the albuterol nebulizer treatment. - Given the severity of the symptoms today, I would have preferred starting a combined ICS/LABA, however [name] wife is concerned with the black box warning. - I did attempt to explain the origins of the warning to them, but they preferred to hold off for now. - It is worth a trial of an inhaled steroid to see if there is any improvement. - Eosinophilic phenotype makes an anti-IL5 agent in excellent addition.  - Fasenra sample given today and Consent sign filled out. - We will send in a prescription for AuviQ since all of our biologic patients (i.e. patients on injectable medications) need epinephrine available.    - Start  Arnuity one inhalation once daily to see if this helps with the coughing/wheezing. - Daily controller medication(s): Arnuity one inhalation once daily + Singulair 10mg  daily + Fasenra monthly - Rescue medications: ProAir 4 puffs every 4-6 hours as needed - Asthma  control goals:  * Full participation in all desired activities (may need albuterol before activity) * Albuterol use two time or less a week on average (not counting use with activity) * Cough interfering with sleep two time or less a month * Oral steroids no more than once a year * No hospitalizations  4. Return in about 3 months (around 08/28/2017).   Subjective:   Ricky Gutierrez is a 46 y.o. male presenting today for evaluation of  Chief Complaint  Patient presents with  . Gastroesophageal Reflux  . Wheezing    Ricky Gutierrez has a history of the following: Patient Active Problem List   Diagnosis Date Noted  . Cough variant asthma vs uacs 03/08/2017  . Morbid (severe) obesity due to excess calories (HCC) 03/08/2017    History obtained from: chart review and patient.  Ricky Gutierrez was referred by Ricky Amen, MD.     Ricky Gutierrez is a 46 y.o. male presenting for evaluation of environmental allergies. He presented with a chronic cough in March but his wife reports that it was happening several months before.  He does carry a diagnosis of asthma. He is followed by Dr. Sandrea Gutierrez, last seen on June 22. It seems that he was initially treated with a burst of prednisone to assess improvement. He was also treated with antibiotics. This combination did improve his symptoms but he continued to have mucus production. He recommended taking chlorpheniramine 4 mg every 4 hours as needed. He also recommended treatment for reflux, And he is now on omeprazole in the morning and Pepcid at night. Dr. Sherene Sires also recommended a sinus CT, but this was too expensive so he never got it done. He was eventually placed on gabapentin 100 mg 3 times a day which seemed to help, but he is no longer taking. He had a normal spirometry on June 22 off of all inhalers, which was normal according to the note. He did have an environmental allergen panel that showed sensitivities to dust  mites, cat, dog, grasses, molds, trees, ragweed, and mouse. He was referred to our group for further management.  He does have throat clearing and allergy symptoms during certain seasons. He is currently on Flonase two sprays per nostril twice daily. This combination with the chlorpheniramine is helping somewhat, but he continues to have the cough. Cold beverages and laying his right side trigger the coughing. He seems to have sensitivites to certain fumes and scents. He currently works at [employer]. 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 This puts him exposed to a multitude of various environmental allergens, including dust and molds.  He does have GERD and takes omeprazole in the morning and Pepcid, whic has helped the coughing. However he continues to have the coughing. He has never been put on a daily controller medication to his knowledge. He has no problem with food allergies and tolerates all of the major food allergens without adverse event. He is unaware of any sensitization to stinging insects. There is no significant infectious history. Vaccinations are up to date.    Past Medical History: Patient Active  Problem List   Diagnosis Date Noted  . Cough variant asthma vs uacs 03/08/2017  . Morbid (severe) obesity due to excess calories (HCC) 03/08/2017    Medication List:  Allergies as of 05/28/2017      Reactions   Fish Allergy    Codeine Nausea Only      Medication List       Accurate as of 05/28/17 11:59 PM. Always use your most recent med list.          Azelastine-Fluticasone 137-50 MCG/ACT Susp Commonly known as:  DYMISTA 2 sprays per nostril 1-2 times daily as needed   chlorpheniramine 4 MG tablet Commonly known as:  CHLOR-TRIMETON Take 4 mg by mouth every 4 (four) hours as needed for allergies.   DELSYM 30 MG/5ML liquid Generic drug:   dextromethorphan Take 5 mLs by mouth every 12 (twelve) hours as needed for cough.   EPINEPHrine 0.3 mg/0.3 mL Soaj injection Commonly known as:  AUVI-Q Take as directed for severe allergic reaction.   famotidine 20 MG tablet Commonly known as:  PEPCID One at bedtime   Fluticasone Furoate 200 MCG/ACT Aepb Commonly known as:  ARNUITY ELLIPTA Inhale 200 mcg into the lungs daily. To prevent coughing or wheezing.   gabapentin 100 MG capsule Commonly known as:  NEURONTIN Take 1 capsule (100 mg total) by mouth 3 (three) times daily. One three times daily   levocetirizine 5 MG tablet Commonly known as:  XYZAL 1 tablet by mouth once daily for itchy eyes.   montelukast 10 MG tablet Commonly known as:  SINGULAIR 1 tablet by mouth once each evening for coughing or wheezing.   omeprazole 40 MG capsule Commonly known as:  PRILOSEC Take 40 mg by mouth daily.   predniSONE 10 MG tablet Commonly known as:  DELTASONE Take  4 each am x 2 days,   2 each am x 2 days,  1 each am x 2 days and stop   PROAIR HFA 108 (90 Base) MCG/ACT inhaler Generic drug:  albuterol Inhale 2 puffs into the lungs every 6 (six) hours as needed for wheezing or shortness of breath.   PROBIOTIC DIGESTIVE SUPPORT PO Take by mouth.       Birth History: non-contributory.   Developmental History: non-contributory.   Past Surgical History: Past Surgical History:  Procedure Laterality Date  . HERNIA REPAIR       Family History: Family History  Problem Relation Age of Onset  . Allergic rhinitis Sister      Social History: [name] lives at home with his family. They live in a house that is 48 years old. There is no mildew or cockroaches in the home. There is carpeting throughout the home. They have electric heating and central cooling. There are 2 dogs inside the home. Does not have dust mite coverings on his bedding. There is no tobacco exposure. 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 He is going to be changing jobs to work for [employer] in the next few months. His wife works as a Runner, broadcasting/film/videoteacher.     Review of Systems: a 14-point review of systems is pertinent for what is mentioned in HPI.  Otherwise, all other systems were negative. Constitutional: negative other than that listed in the HPI Eyes: negative other than that listed in the HPI Ears, nose, mouth, throat, and face: negative other than that listed in the HPI Respiratory: negative other than that listed in the HPI Cardiovascular: negative other than  that listed in the HPI Gastrointestinal: negative other than that listed in the HPI Genitourinary: negative other than that listed in the HPI Integument: negative other than that listed in the HPI Hematologic: negative other than that listed in the HPI Musculoskeletal: negative other than that listed in the HPI Neurological: negative other than that listed in the HPI Allergy/Immunologic: negative other than that listed in the HPI    Objective:   Blood pressure (!) 140/92, pulse 72, temperature 98 F (36.7 C), temperature source Oral, resp. rate 16, height 5' 9.88" (1.775 m), weight (!) 305 lb 12.5 oz (138.7 kg), SpO2 95 %. Body mass index is 44.02 kg/m.   Physical Exam:  General: Alert, interactive, in no acute distress. Obese male. Very interactive and enjoyable. Eyes: No conjunctival injection present on the right, No conjunctival injection present on the left, PERRL bilaterally, No discharge on the right, No discharge on the left, No Horner-Trantas dots present and allergic shiners present bilaterally Ears: Right TM pearly gray with normal light reflex, Left TM pearly gray with normal light reflex, Right TM intact without perforation and Left TM intact without perforation.  Nose/Throat: External nose within normal limits, nasal crease present and septum midline, turbinates markedly edematous and pale with thick discharge, post-pharynx markedly  erythematous with cobblestoning in the posterior oropharynx. Tonsils 3+ without exudates Neck: Supple without thyromegaly.  Adenopathy: Shoddy bilateral anterior cervical lymphadenopathy. and No enlarged lymph nodes appreciated in the occipital, axillary, epitrochlear, inguinal, or popliteal regions. Lungs: Decreased breath sounds with expiratory wheezing bilaterally. No increased work of breathing. Decreased air movement at the bases. CV: Normal S1/S2, no murmurs. Capillary refill <2 seconds.  Abdomen: Nondistended, nontender. No guarding or rebound tenderness. Bowel sounds faint and present in all fields. Difficult to evaluate due to body habitus  Skin: Warm and dry, without lesions or rashes. Extremities:  No clubbing, cyanosis or edema. Neuro:   Grossly intact. No focal deficits appreciated. Responsive to questions.  Diagnostic studies:   Spirometry: results abnormal (FEV1: 2.19/64%, FVC: 2.40/56%, FEV1/FVC: 91%).    Spirometry consistent with possible restrictive disease. Albuterol/Atrovent nebulizer treatment given in clinic with significant improvement. There was a 400 mL increase in the FVC (80%) and a 340 mL increase in the FEV1 (60%). This is significant for ATS criteria.  Allergy Studies:   Indoor/Outdoor Molds Percutaneous Adult Environmental: positive to Drechslera, Fusarium, Aureobasidium, epicoccum and Phoma. Otherwise negative to the remaining molds with adequate controls.   Malachi Bonds, MD FAAAAI [facility]

## 2017-05-28 NOTE — Patient Instructions (Addendum)
1. Seasonal and perennial allergic rhinitis - Testing today showed: trees, weeds, grasses, molds, dust mites, cat, dog and cockroach - Avoidance measures provided. - Stop the chlorpheniramine for now and we will start a 24-hour antihistamine instead.  - Stop the Flonase.  - Start Xyzal (levocetirizine) 5mg  tablet once daily, Singulair (montelukast) 10mg  daily and Dymista (fluticasone/azelastine) two sprays per nostril 1-2 times daily as needed - You can use an extra dose of the antihistamine, if needed, for breakthrough symptoms.  - Consider nasal saline rinses 1-2 times daily to remove allergens from the nasal cavities as well as help with mucous clearance (this is especially helpful to do before the nasal sprays are given) - Consider allergy shots as a means of long-term control. - Allergy shots "re-train" the immune system to ignore environmental allergens and decrease the resulting immune response to those allergens (sneezing, itchy watery eyes, runny nose, nasal congestion, etc).   - We can discuss more at the next appointment if the medications are not working for you. - Once you change your insurance, you could check on the price of the shots. - Once the Harrington ChallengerFasenra has had a few months to work, we might know whether the allergy shots are necessarily needed.   2. Cough - Continue with the GERD medications.  - Talk to Dr. Sherene SiresWert about restarting the gabapentin (Neurontin) since it was not clear whether he wanted you to continue that. - We will see whether the Harrington ChallengerFasenra provides any improvement in the coughing episodes.   3. Moderate persistent asthma - Lung testing today was not normal, but you did improve with albuterol. - You were wheezing on exam today as well, and this did improve with the albuterol nebulizer treatment. - It is still unclear whether you have asthma, per say, but it is worth a trial of an inhaled steroid to see if there is any improvement.  Harrington Challenger- Fasenra sample given today and  Consent sign filled out. - We will send in a prescription for AuviQ since all of our biologic patients (i.e. patients on injectable medications) need epinephrine available.  - Start Arnuity 200mcg one inhalation once daily to see if this helps elwith the coughing/wheezing. - Daily controller medication(s): Arnuity 200mcg one inhalation once daily + Singulair 10mg  daily + Fasenra monthly - Rescue medications: ProAir 4 puffs every 4-6 hours as needed - Asthma control goals:  * Full participation in all desired activities (may need albuterol before activity) * Albuterol use two time or less a week on average (not counting use with activity) * Cough interfering with sleep two time or less a month * Oral steroids no more than once a year * No hospitalizations  4. Return in about 3 months (around 08/28/2017).   Please inform us of any Emergency Department visits, hospitalizations, or changes in symptoms. Call us before going to the ED for breathing or allergy symptoms since we might be able to fit you in for a sick visit. Feel free to contact us anytime with any questions, problems, or concerns.  It was a pleasure to meet you and your family today! Happy summer!   Websites that have reliable patient information: 1. American Academy of Asthma, Allergy, and Immunology: www.aaaai.org 2. Food Allergy Research and Education (FARE): foodallergy.org 3. Mothers of Asthmatics: http://www.asthmacommunitynetwork.org 4. American College of Allergy, Asthma, and Immunology: www.acaai.org   Reducing Pollen Exposure  The American Academy of Allergy, Asthma and Immunology suggests the following steps to reduce your exposure to pollen during allergy  seasons.    1. Do not hang sheets or clothing out to dry; pollen may collect on these items. 2. Do not mow lawns or spend time around freshly cut grass; mowing stirs up pollen. 3. Keep windows closed at night.  Keep car windows closed while driving. 4. Minimize  morning activities outdoors, a time when pollen counts are usually at their highest. 5. Stay indoors as much as possible when pollen counts or humidity is high and on windy days when pollen tends to remain in the air longer. 6. Use air conditioning when possible.  Many air conditioners have filters that trap the pollen spores. 7. Use a HEPA room air filter to remove pollen form the indoor air you breathe.  Control of Mold Allergen  Mold and fungi can grow on a variety of surfaces provided certain temperature and moisture conditions exist.  Outdoor molds grow on plants, decaying vegetation and soil.  The major outdoor mold, Alternaria and Cladosporium, are found in very high numbers during hot and dry conditions.  Generally, a late Summer - Fall peak is seen for common outdoor fungal spores.  Rain will temporarily lower outdoor mold spore count, but counts rise rapidly when the rainy period ends.  The most important indoor molds are Aspergillus and Penicillium.  Dark, humid and poorly ventilated basements are ideal sites for mold growth.  The next most common sites of mold growth are the bathroom and the kitchen.  Outdoor [employer] 1. Use air conditioning and keep windows closed 2. Avoid exposure to decaying vegetation. 3. Avoid leaf raking. 4. Avoid grain handling. 5. Consider wearing a face mask if working in moldy areas.  Indoor Mold Control 1. Maintain humidity below 50%. 2. Clean washable surfaces with 5% bleach solution. 3. Remove sources e.g. contaminated carpets.  Control of House Dust Mite Allergen    House dust mites play a major role in allergic asthma and rhinitis.  They occur in environments with high humidity wherever human skin, the food for dust mites is found. High levels have been detected in dust obtained from mattresses, pillows, carpets, upholstered furniture, bed covers, clothes and soft toys.  The principal allergen of the house dust mite is found in its feces.  A  gram of dust may contain 1,000 mites and 250,000 fecal particles.  Mite antigen is easily measured in the air during house cleaning activities.    1. Encase mattresses, including the box spring, and pillow, in an air tight cover.  Seal the zipper end of the encased mattresses with wide adhesive tape. 2. Wash the bedding in water of 130 degrees Farenheit weekly.  Avoid cotton comforters/quilts and flannel bedding: the most ideal bed covering is the dacron comforter. 3. Remove all upholstered furniture from the bedroom. 4. Remove carpets, carpet padding, rugs, and non-washable window drapes from the bedroom.  Wash drapes weekly or use plastic window coverings. 5. Remove all non-washable stuffed toys from the bedroom.  Wash stuffed toys weekly. 6. Have the room cleaned frequently with a vacuum cleaner and a damp dust-mop.  The patient should not be in a room which is being cleaned and should wait 1 hour after cleaning before going into the room. 7. Close and seal all heating outlets in the bedroom.  Otherwise, the room will become filled with dust-laden air.  An electric heater can be used to heat the room. 8. Reduce indoor humidity to less than 50%.  Do not use a humidifier.  Control of Dog or Cat  Allergen  Avoidance is the best way to manage a dog or cat allergy. If you have a dog or cat and are allergic to dog or cats, consider removing the dog or cat from the home. If you have a dog or cat but don't want to find it a new home, or if your family wants a pet even though someone in the household is allergic, here are some strategies that may help keep symptoms at bay:  1. Keep the pet out of your bedroom and restrict it to only a few rooms. Be advised that keeping the dog or cat in only one room will not limit the allergens to that room. 2. Don't pet, hug or kiss the dog or cat; if you do, wash your hands with soap and water. 3. High-efficiency particulate air (HEPA) cleaners run continuously in a  bedroom or living room can reduce allergen levels over time. 4. Regular use of a high-efficiency vacuum cleaner or a central vacuum can reduce allergen levels. 5. Giving your dog or cat a bath at least once a week can reduce airborne allergen.  Control of Cockroach Allergen  Cockroach allergen has been identified as an important cause of acute attacks of asthma, especially in urban settings.  There are fifty-five species of cockroach that exist in the [place], however only three, the [place], [place] and Oriental species produce allergen that can affect patients with Asthma.  Allergens can be obtained from fecal particles, egg casings and secretions from cockroaches.    1. Remove food sources. 2. Reduce access to water. 3. Seal access and entry points. 4. Spray runways with 0.5-1% Diazinon or Chlorpyrifos 5. Blow boric acid power under stoves and refrigerator. 6. Place bait stations (hydramethylnon) at feeding sites.

## 2017-05-29 DIAGNOSIS — K219 Gastro-esophageal reflux disease without esophagitis: Secondary | ICD-10-CM | POA: Insufficient documentation

## 2017-05-29 DIAGNOSIS — J4540 Moderate persistent asthma, uncomplicated: Secondary | ICD-10-CM | POA: Insufficient documentation

## 2017-05-29 DIAGNOSIS — J302 Other seasonal allergic rhinitis: Secondary | ICD-10-CM | POA: Insufficient documentation

## 2017-05-29 DIAGNOSIS — J3089 Other allergic rhinitis: Principal | ICD-10-CM

## 2017-06-04 ENCOUNTER — Ambulatory Visit (INDEPENDENT_AMBULATORY_CARE_PROVIDER_SITE_OTHER): Payer: 59 | Admitting: Internal Medicine

## 2017-06-04 ENCOUNTER — Encounter: Payer: Self-pay | Admitting: Internal Medicine

## 2017-06-04 VITALS — BP 126/74 | HR 75 | Ht 70.0 in | Wt 310.0 lb

## 2017-06-04 DIAGNOSIS — J45991 Cough variant asthma: Secondary | ICD-10-CM

## 2017-06-04 MED ORDER — OMEPRAZOLE 40 MG PO CPDR: 40.0000 mg | DELAYED_RELEASE_CAPSULE | Freq: Every day | ORAL | 11 refills | Status: DC

## 2017-06-04 MED ORDER — BECLOMETHASONE DIPROP HFA 80 MCG/ACT IN AERB: 2.0000 | INHALATION_SPRAY | Freq: Two times a day (BID) | RESPIRATORY_TRACT | 11 refills | Status: DC

## 2017-06-04 MED ORDER — BECLOMETHASONE DIPROP HFA 80 MCG/ACT IN AERB: 2.0000 | INHALATION_SPRAY | Freq: Two times a day (BID) | RESPIRATORY_TRACT | 0 refills | Status: DC

## 2017-06-04 NOTE — Progress Notes (Signed)
Subjective:     Patient ID: Ricky Gutierrez, male   DOB: 1971/03/28,   MRN: [id number]    Brief patient profile:  46  yobm never smoker grew up [place] good athlete no respiratory problems until around 2013 rhinitis then around 2016 with cough/ sob esp drinking cold liquids or sleeping R side much worse starting March 2018 > UC x 3 > much better while on prednisone than p finished it so referred to pulmonary clinic 03/08/2017 by [facility]     History of Present Illness  03/08/2017 1st [facility] office visit/ Ricky Gutierrez  On singulair  Chief Complaint  Patient presents with  . Pulmonary Consult    Referred by Dr. Geoffery Lyons.  Pt c/o SOB for the past 2 yrs. He c/o cough for 2 months- occ prod with gold sputum.  He states he gets SOB with and without exertion. He feels worse if he lies on his right side. He states he wakes up in the night coughing. He has also noticed that cold liquids make his cough worse.   despite prednisone/ abx and feeling "better" still has sensation of something stuck in throat but very little actual mucus  production pred works much  Better than albuterol  - symptoms worse at hs / was waking him up but not now on hycodan cough syrup at [facility] with overt Hb but no active sinus symptoms  No better with tessilon or delsym  rec Please see patient coordinator before you leave today  to schedule sinus CT asap > too expensive/ canceled  Omeprazole  40 mg   Take  30-60 min before first meal of the day and Pepcid (famotidine)  20 mg one @  bedtime until return to office - this is the best way to tell whether stomach acid is contributing to your problem.   For drainage / throat tickle try take CHLORPHENIRAMINE  4 mg - take one every 4 hours as needed  GERD > did not take as rec  Please schedule a follow up office visit in 2  weeks, sooner if needed  with all medications /inhalers/ solutions in hand so we can verify exactly what you are taking. This includes all medications from  all doctors and over the counters - if not improved send allergy profile/ consider trial of gabapentin     03/26/2017  f/u ov/Ricky Gutierrez re:  Cough since 2016 rx  maint rx ppi/gerd  Chief Complaint  Patient presents with  . Follow-up    Cough has improved some, but still bothers him on occ.    main concern is "something stuck in throat"  But only aware of it during the day, not noct.  Not limited by breathing from desired activities  / minimal albuterol off singulair since last ov / denies nasal symptoms rec Gabapentin 100 mg three times daily  For drainage / throat tickle try take CHLORPHENIRAMINE  4 mg - take one every 4 hours as needed - available over the counter- may cause drowsiness so start with just a bedtime dose or two and see how you tolerate it before trying in daytime   Life savers and jolly ranchers and stovers all come in sugarless versions  Please remember to go to the lab department > POS ATOPY> referred to Allergy/ Dr Dellis Anes    04/23/2017  f/u ov/Ricky Gutierrez re:  ? Cough variant asthma vs uacs/  allergic rhinitis  maint on gabapentin/ gerd rx/ 1st gen h1  Chief Complaint  Patient  presents with  . Follow-up    Cough continues to improve.   sleeps fine /  Some wheeze one day prior to OV  After stuck in a car for sev hours with his dog/ otherwise doing much better No longer needing cough meds or saba  rec Try off reflux medications and let allergist  know if you notice any change - if significant then restart both prilosec and pepcid as before  For drainage / throat tickle try take CHLORPHENIRAMINE  4 mg - take one every 4 hours as needed - available over the counter- may cause drowsiness so start with just a bedtime dose or two and see how you tolerate it before trying in daytime   If symptoms worsen between now and your allergy eval:  Prednisone 10 mg take  4 each am x 2 days,   2 each am x 2 days,  1 each am x 2 days and stop      06/04/2017  f/u ov/Ricky Gutierrez re: asthma vs uacs now  on arnuity/ singulair/ dymista and cough worse despite  back on gerd rx   Chief Complaint  Patient presents with  . Acute Visit    pt made appt for increased sob, saw allergist who changed rx's and is now having no complaints.    appt was made sev weeks prior to OV  and in meantime got steroid shot and much better but also started on arnuity and having lots of daytime dry cough / breathing better though   No obvious day to day or daytime variability or assoc excess/ purulent sputum or mucus plugs or hemoptysis or cp or chest tightness, subjective wheeze or overt sinus or hb symptoms. No unusual exp hx or h/o childhood pna/ asthma or knowledge of premature birth.  Sleeping ok without nocturnal  or early am exacerbation  of respiratory  c/o's or need for noct saba. Also denies any obvious fluctuation of symptoms with weather or environmental changes or other aggravating or alleviating factors except as outlined above   Current Medications, Allergies, Complete Past Medical History, Past Surgical History, Family History, and Social History were reviewed in [facility] electronic medical record.  ROS  The following are not active complaints unless bolded sore throat, dysphagia, dental problems, itching, sneezing,  nasal congestion or excess/ purulent secretions, ear ache,   fever, chills, sweats, unintended wt loss, classically pleuritic or exertional cp,  orthopnea pnd or leg swelling, presyncope, palpitations, abdominal pain, anorexia, nausea, vomiting, diarrhea  or change in bowel or bladder habits, change in stools or urine, dysuria,hematuria,  rash, arthralgias, visual complaints, headache, numbness, weakness or ataxia or problems with walking or coordination,  change in mood/affect or memory.               Objective:   Physical Exam    amb obese bm nad but vigorous throat clearing    06/04/2017           310 04/23/2017         313   03/26/2017        306   03/08/17 (!) 310 lb (140.6 kg)   03/02/17 (!) 305 lb (138.3 kg)    Vital signs reviewed  - Note on arrival 02 sats  95% on RA     HEENT: nl dentition,  and oropharynx.   - mild  bilateral non-specific turbinate edema         NECK :  without JVD/Nodes/TM/ nl carotid upstrokes bilaterally  LUNGS: no acc muscle use,  Nl contour chest which is clear to A and P bilaterally without cough on insp or exp maneuvers   CV:  RRR  no s3 or murmur or increase in P2, and no edema   ABD: obese  soft and nontender with nl inspiratory excursion in the supine position. No bruits or organomegaly appreciated, bowel sounds nl  MS:  Nl gait/ ext warm without deformities, calf tenderness, cyanosis or clubbing No obvious joint restrictions   SKIN: warm and dry without lesions    NEURO:  alert, approp, nl sensorium with  no motor or cerebellar deficits apparent.      I personally reviewed images and agree with radiology impression as follows:  CXR:   03/02/17 No active cardiopulmonary disease.       Assessment:

## 2017-06-04 NOTE — Patient Instructions (Addendum)
Stop arnuity and try qvar 80 Take 2 puffs first thing in am and then another 2 puffs about 12 hours later.   Only use your albuterol as a rescue medication to be used if you can't catch your breath by resting or doing a relaxed purse lip breathing pattern.  - The less you use it, the better it will work when you need it. - Ok to use up to 2 puffs  every 4 hours if you must but call for immediate appointment if use goes up over your usual need - Don't leave home without it !!  (think of it like the spare tire for your car)    Pulmonary follow up is as needed  Add:  Be sure pt understands that as long as cough/throat clearing >>   prilosec otc 20mg   Take 30-60 min before first meal of the day and Pepcid ac (famotidine) 20 mg one @  bedtime until cough is completely gone for at least a week without the need for cough suppression

## 2017-06-05 NOTE — Assessment & Plan Note (Signed)
Body mass index is 44.48 kg/m.  -  trending up / need to avoid systemic steroids as much as possible in this setting  No results found for: TSH   Contributing to gerd risk/ doe/reviewed the need and the process to achieve and maintain neg calorie balance > defer f/u primary care including intermittently monitoring thyroid status

## 2017-06-05 NOTE — Assessment & Plan Note (Addendum)
Spirometry 03/08/2017  No obst on last day of pred taper   - Sinus CT 03/08/2017 > declined due to cost - Allergy profile 03/26/2017 >  Eos 0.4 /  IgE  422 RAST pos to virtually everything > refer to allergy  - Gabapentin trial 03/26/2017 >>> improved 04/23/2017 @ 100 mg tid > continue  - Spirometry 04/23/2017  Off saba and pred > no obst - eval by allergy Dellis Anes([place]) 05/28/17 > rec fasenra/arnuity   - 06/04/2017  After extensive coaching HFA effectiveness =    90% with redihaler and coughing on arnuity so rec change to qvar 80 2bid   Very difficult to sort out asthma vs UACS so rec not using dpi in this setting as it is one of the major causes of uacs (along with gerd and acei)   If improves on the 80 strength can do step down per allergy and stop gerd rx/ gabapentin and pulmonary f/u prn   I had an extended discussion with the patient reviewing all relevant studies completed to date and  lasting 15 to 20 minutes of a 25 minute acute visit    Each maintenance medication was reviewed in detail including most importantly the difference between maintenance and prns and under what circumstances the prns are to be triggered using an action plan format that is not reflected in the computer generated alphabetically organized AVS.    Please see AVS for specific instructions unique to this visit that I personally wrote and verbalized to the the pt in detail and then reviewed with pt  by my nurse highlighting any  changes in therapy recommended at today's visit to their plan of care.

## 2017-06-08 ENCOUNTER — Other Ambulatory Visit: Payer: Self-pay

## 2017-06-24 ENCOUNTER — Telehealth: Payer: Self-pay | Admitting: *Deleted

## 2017-06-24 NOTE — Telephone Encounter (Signed)
I had left several messages for patient since 06/03/17 regarding his rx for Harrington Challenger at [facility] ready.  Patient did not return call until today to advise that he had decided to start therapy due to his work and having to travel all the time.  I will D/C the order.

## 2017-06-25 NOTE — Telephone Encounter (Signed)
Noted. Thanks for letting me know!   Jasmeet Gehl, MD [facility]  

## 2018-06-05 ENCOUNTER — Other Ambulatory Visit: Payer: Self-pay | Admitting: Internal Medicine

## 2018-07-23 ENCOUNTER — Other Ambulatory Visit: Payer: Self-pay | Admitting: Allergy & Immunology

## 2018-08-07 IMAGING — CR DG CHEST 2V
2 series · 2 of 2 positions shown · non-contrast
Comparison: 02/09/2017

CLINICAL DATA: Cough and dyspnea for 1 month, worsened tonight.

EXAM:
CHEST  2 VIEW

[w chest lat]
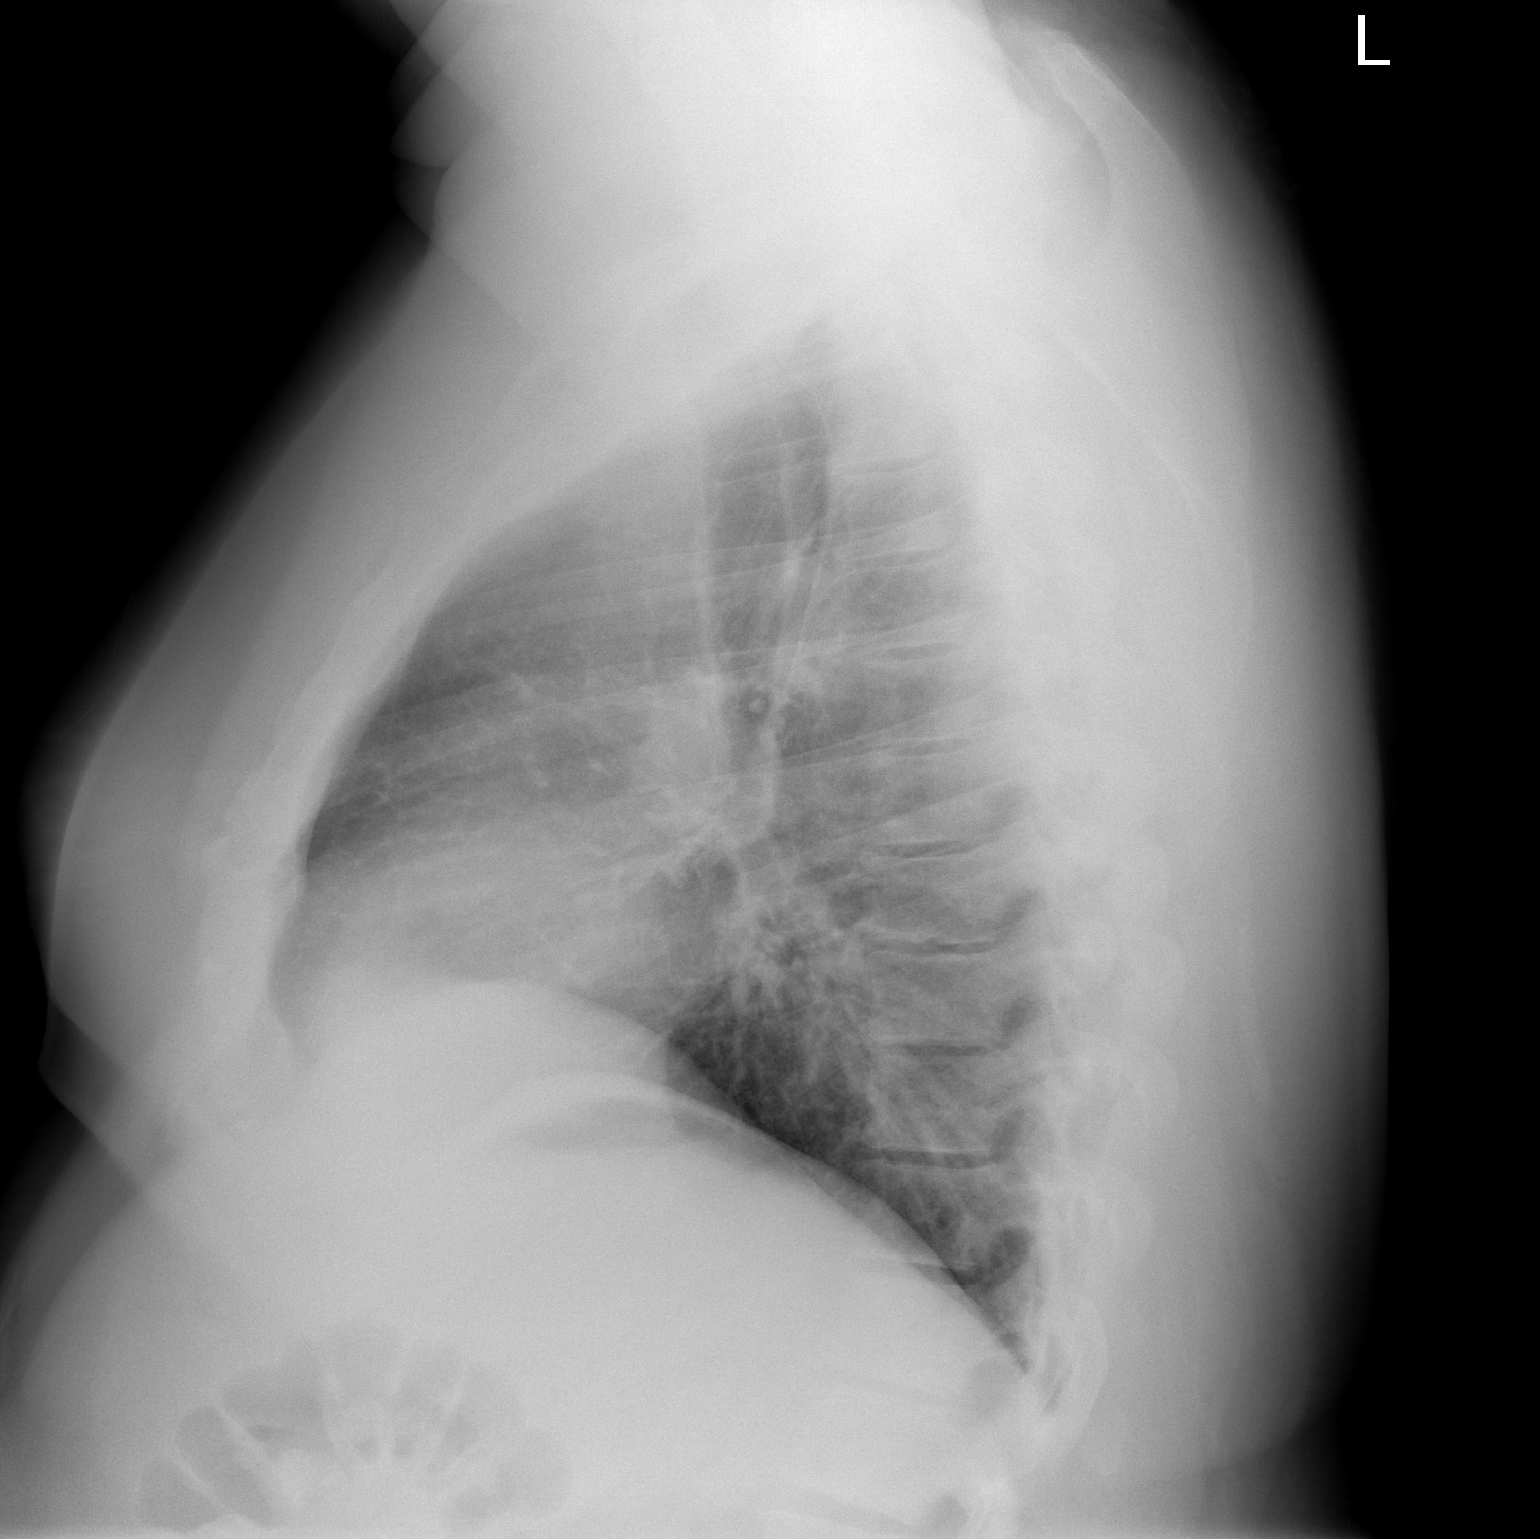

[w chest pa]
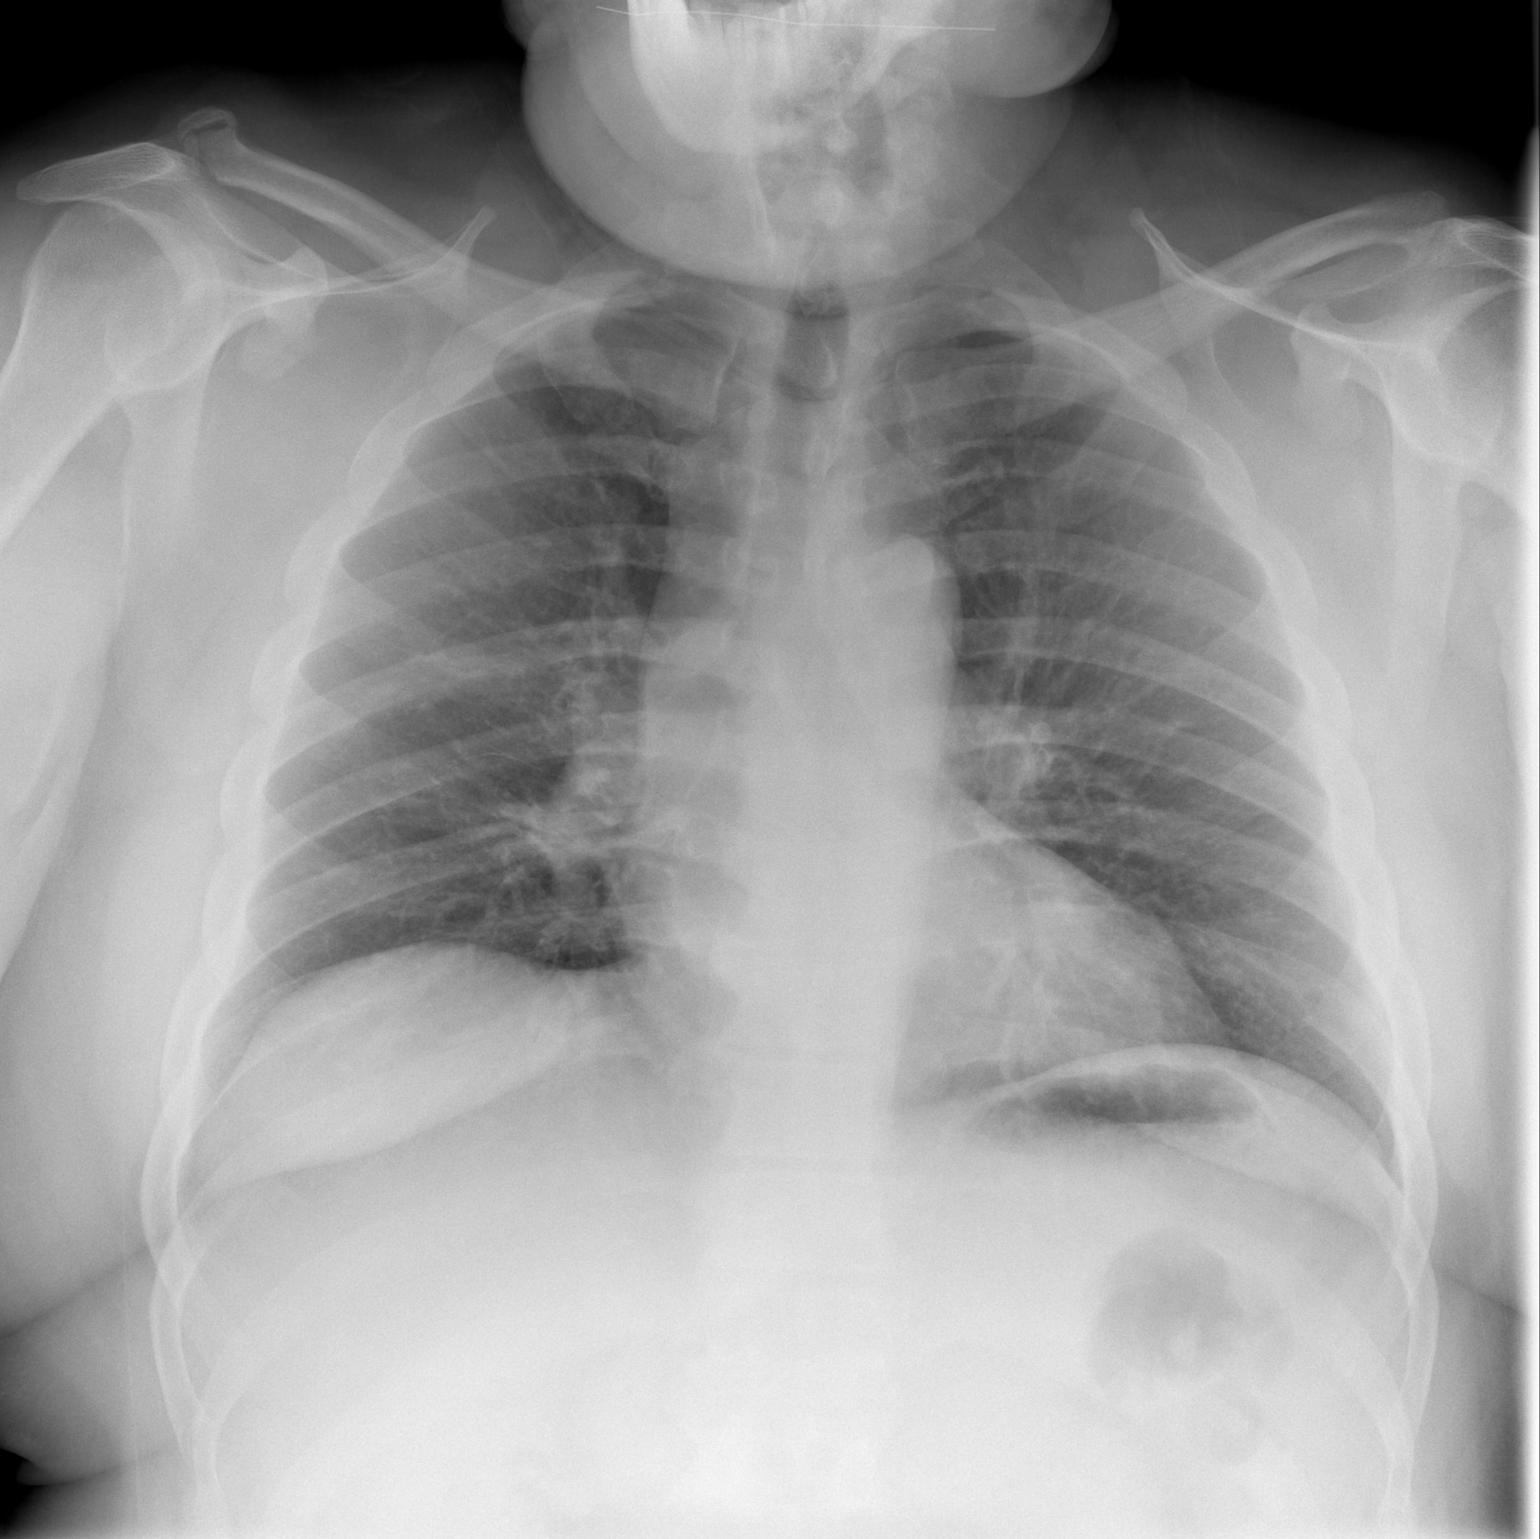

[2 of 2 positions shown; findings below may reference images not displayed]

FINDINGS: The lungs are clear. The pulmonary vasculature is normal. Heart size
is normal. Hilar and mediastinal contours are unremarkable. There is
no pleural effusion.
IMPRESSION: No active cardiopulmonary disease.

## 2020-01-01 ENCOUNTER — Other Ambulatory Visit: Payer: Self-pay

## 2020-01-04 ENCOUNTER — Telehealth: Payer: Self-pay | Admitting: Internal Medicine

## 2020-01-04 NOTE — Telephone Encounter (Signed)
ATC Patient.  LM to call back. 

## 2020-01-04 NOTE — Telephone Encounter (Signed)
Pt returning a phone call. Pt can be reached [phone].

## 2020-01-05 NOTE — Telephone Encounter (Signed)
Spoke with patient.  Needs epi pen Last ov in 2018 with MW  MW please advise

## 2020-01-05 NOTE — Telephone Encounter (Signed)
Called and spoke with pt letting him know the info from [facility] and he verbalized understanding. Nothing further needed.

## 2020-01-05 NOTE — Telephone Encounter (Signed)
Since he hasn't been seen in sev years the doctor who is treating his asthma/allergy needs to be the one who advises on epipen injection - I believe that's Dr Dellis Anes?

## 2020-06-21 ENCOUNTER — Emergency Department (HOSPITAL_BASED_OUTPATIENT_CLINIC_OR_DEPARTMENT_OTHER): Payer: 59

## 2020-06-21 ENCOUNTER — Encounter (HOSPITAL_BASED_OUTPATIENT_CLINIC_OR_DEPARTMENT_OTHER): Payer: Self-pay | Admitting: Emergency Medicine

## 2020-06-21 ENCOUNTER — Other Ambulatory Visit: Payer: Self-pay

## 2020-06-21 ENCOUNTER — Emergency Department (HOSPITAL_BASED_OUTPATIENT_CLINIC_OR_DEPARTMENT_OTHER)
Admission: EM | Admit: 2020-06-21 | Discharge: 2020-06-21 | Disposition: A | Discharge status: Home / Self Care | Payer: 59 | Attending: Emergency Medicine | Admitting: Emergency Medicine

## 2020-06-21 DIAGNOSIS — R109 Unspecified abdominal pain: Secondary | ICD-10-CM

## 2020-06-21 DIAGNOSIS — E669 Obesity, unspecified: Secondary | ICD-10-CM | POA: Diagnosis not present

## 2020-06-21 DIAGNOSIS — R1032 Left lower quadrant pain: Secondary | ICD-10-CM | POA: Insufficient documentation

## 2020-06-21 DIAGNOSIS — Z8719 Personal history of other diseases of the digestive system: Secondary | ICD-10-CM | POA: Insufficient documentation

## 2020-06-21 LAB — URINALYSIS, ROUTINE W REFLEX MICROSCOPIC
Bilirubin Urine: NEGATIVE
Glucose, UA: NEGATIVE mg/dL
Ketones, ur: NEGATIVE mg/dL
Leukocytes,Ua: NEGATIVE
Nitrite: NEGATIVE
Protein, ur: 100 mg/dL — AB
Specific Gravity, Urine: >1.03 — ABNORMAL HIGH (ref 1.005–1.030)
pH: 6 (ref 5.0–8.0)

## 2020-06-21 LAB — CBC WITH DIFFERENTIAL/PLATELET
Abs Immature Granulocytes: 0.05 10*3/uL (ref 0.00–0.07)
Basophils Absolute: 0.1 10*3/uL (ref 0.0–0.1)
Basophils Relative: 1 %
Eosinophils Absolute: 0.1 10*3/uL (ref 0.0–0.5)
Eosinophils Relative: 1 %
HCT: 43.2 % (ref 39.0–52.0)
Hemoglobin: 14.6 g/dL (ref 13.0–17.0)
Immature Granulocytes: 1 %
Lymphocytes Relative: 12 %
Lymphs Abs: 1.3 10*3/uL (ref 0.7–4.0)
MCH: 30.7 pg (ref 26.0–34.0)
MCHC: 33.8 g/dL (ref 30.0–36.0)
MCV: 90.8 fL (ref 80.0–100.0)
Monocytes Absolute: 0.8 10*3/uL (ref 0.1–1.0)
Monocytes Relative: 7 %
Neutro Abs: 8.4 10*3/uL — ABNORMAL HIGH (ref 1.7–7.7)
Neutrophils Relative %: 78 %
Platelets: 251 10*3/uL (ref 150–400)
RBC: 4.76 MIL/uL (ref 4.22–5.81)
RDW: 12.9 % (ref 11.5–15.5)
WBC: 10.6 10*3/uL — ABNORMAL HIGH (ref 4.0–10.5)
nRBC: 0 % (ref 0.0–0.2)

## 2020-06-21 LAB — URINALYSIS, MICROSCOPIC (REFLEX)

## 2020-06-21 LAB — BASIC METABOLIC PANEL
Anion gap: 9 (ref 5–15)
BUN: 14 mg/dL (ref 6–20)
CO2: 23 mmol/L (ref 22–32)
Calcium: 9.3 mg/dL (ref 8.9–10.3)
Chloride: 102 mmol/L (ref 98–111)
Creatinine, Ser: 0.78 mg/dL (ref 0.61–1.24)
GFR calc Af Amer: >60 mL/min (ref >60)
GFR calc non Af Amer: >60 mL/min (ref >60)
Glucose, Bld: 112 mg/dL — ABNORMAL HIGH (ref 70–99)
Potassium: 4.1 mmol/L (ref 3.5–5.1)
Sodium: 134 mmol/L — ABNORMAL LOW (ref 135–145)

## 2020-06-21 MED ORDER — ONDANSETRON HCL 4 MG/2ML IJ SOLN
4.0000 mg | Freq: Once | INTRAMUSCULAR | Status: AC
  Administered 2020-06-21: 4 mg via INTRAVENOUS
  Filled 2020-06-21: qty 2

## 2020-06-21 MED ORDER — HYDROMORPHONE HCL 1 MG/ML IJ SOLN
1.0000 mg | Freq: Once | INTRAMUSCULAR | Status: AC
  Administered 2020-06-21: 1 mg via INTRAVENOUS
  Filled 2020-06-21: qty 1

## 2020-06-21 MED ORDER — KETOROLAC TROMETHAMINE 15 MG/ML IJ SOLN
15.0000 mg | Freq: Once | INTRAMUSCULAR | Status: AC
  Administered 2020-06-21: 15 mg via INTRAVENOUS
  Filled 2020-06-21: qty 1

## 2020-06-21 NOTE — ED Triage Notes (Signed)
Pt reports left flank pain for a few days with nausea no vomiting or fever. Hx of kidney stones.

## 2020-06-21 NOTE — Discharge Instructions (Signed)
Your CT scan today does not show a kidney stone or other acute pathology.  There is a small cyst on your left kidney.  I doubt that this is actually the source of your symptoms though.  It is likely benign, but radiology is recommending follow-up for an ultrasound to further evaluate it.  This can be arranged by your PCP.  Take 600 mg of ibuprofen every 6 hours as needed for continued pain.

## 2020-06-21 NOTE — ED Notes (Signed)
Patient transported to CT 

## 2020-06-21 NOTE — ED Notes (Signed)
ED Provider at bedside discussing test results and dispo plan of care. 

## 2020-06-26 NOTE — ED Provider Notes (Signed)
[facility] EMERGENCY DEPARTMENT Provider Note   CSN: [id number] Arrival date & time: 06/21/20  1057     History Chief Complaint  Patient presents with  . Flank Pain    Ricky Gutierrez is a 49 y.o. male.  HPI   49 year old male with left flank pain.  Onset a couple days ago.  Pain is constant.  No recent exacerbating leaving factors.  Denies any trauma or strain.  History of kidney stones.  Concerned that current symptoms may be from another one.  No acute urinary complaints.  Past Medical History:  Diagnosis Date  . Chronic cough   . Food allergy   . GERD (gastroesophageal reflux disease)   . Reactive airway disease     Patient Active Problem List   Diagnosis Date Noted  . Moderate persistent asthma without complication 05/29/2017  . Gastroesophageal reflux disease 05/29/2017  . Seasonal and perennial allergic rhinitis 05/29/2017  . Cough variant asthma vs uacs 03/08/2017  . Morbid (severe) obesity due to excess calories (HCC) 03/08/2017    Past Surgical History:  Procedure Laterality Date  . HERNIA REPAIR         Family History  Problem Relation Age of Onset  . Allergic rhinitis Sister     Social History   Tobacco Use  . Smoking status: Never Smoker  . Smokeless tobacco: Never Used  Substance Use Topics  . Alcohol use: No  . Drug use: No    Home Medications Prior to Admission medications   Medication Sig Start Date End Date Taking? Authorizing Provider  albuterol (PROAIR HFA) 108 (90 Base) MCG/ACT inhaler Inhale 2 puffs into the lungs every 6 (six) hours as needed for wheezing or shortness of breath.    [provider]  Azelastine-Fluticasone [facility]) 137-50 MCG/ACT SUSP 2 sprays per nostril 1-2 times daily as needed 05/28/17   Alfonse Spruce, MD  beclomethasone (QVAR REDIHALER) 80 MCG/ACT inhaler Inhale 2 puffs into the lungs 2 (two) times daily. 06/04/17   Nyoka Cowden, MD  chlorpheniramine (CHLOR-TRIMETON) 4 MG  tablet Take 4 mg by mouth every 4 (four) hours as needed for allergies.    [provider]  dextromethorphan (DELSYM) 30 MG/5ML liquid Take 5 mLs by mouth every 12 (twelve) hours as needed for cough.    [provider]  EPINEPHrine (AUVI-Q) 0.3 mg/0.3 mL IJ SOAJ injection Take as directed for severe allergic reaction. 05/28/17   Alfonse Spruce, MD  famotidine (PEPCID) 20 MG tablet One at bedtime 03/08/17   Nyoka Cowden, MD  gabapentin (NEURONTIN) 100 MG capsule Take 1 capsule (100 mg total) by mouth 3 (three) times daily. One three times daily 03/26/17   Nyoka Cowden, MD  lactobacillus acidophilus (BACID) TABS tablet Take 1 tablet by mouth daily.    [provider]  Lactobacillus-Inulin (PROBIOTIC DIGESTIVE SUPPORT PO) Take by mouth.    [provider]  levocetirizine (XYZAL) 5 MG tablet 1 tablet by mouth once daily for itchy eyes. 05/28/17   Alfonse Spruce, MD  montelukast (SINGULAIR) 10 MG tablet 1 tablet by mouth once each evening for coughing or wheezing. 05/28/17   Alfonse Spruce, MD  omeprazole (PRILOSEC) 40 MG capsule Take 1 capsule (40 mg total) by mouth daily. 06/04/17   Nyoka Cowden, MD  TURMERIC PO Take 1 capsule by mouth daily.    [provider]    Allergies    Fish allergy and Codeine  Review of Systems  Review of Systems All systems reviewed and negative, other than as noted in HPI.  Physical Exam Updated Vital Signs BP 120/77 (BP Location: Left Arm)   Pulse 68   Temp 98.6 F (37 C) (Oral)   Resp 16   Ht 5\' 11"  (1.803 m)   Wt (!) 144.7 kg   SpO2 95%   BMI 44.49 kg/m   Physical Exam Vitals and nursing note reviewed.  Constitutional:      General: He is not in acute distress.    Appearance: He is well-developed. He is obese.  HENT:     Head: Normocephalic and atraumatic.  Eyes:     General:        Right eye: No discharge.        Left eye: No discharge.     Conjunctiva/sclera: Conjunctivae  normal.  Cardiovascular:     Rate and Rhythm: Normal rate and regular rhythm.     Heart sounds: Normal heart sounds. No murmur heard.  No friction rub. No gallop.   Pulmonary:     Effort: Pulmonary effort is normal. No respiratory distress.     Breath sounds: Normal breath sounds.  Abdominal:     General: There is no distension.     Palpations: Abdomen is soft.     Tenderness: There is abdominal tenderness.     Comments: Tenderness to palpation left lower quadrant to left flank.  No CVA tenderness.  Musculoskeletal:        General: No tenderness.     Cervical back: Neck supple.  Skin:    General: Skin is warm and dry.  Neurological:     Mental Status: He is alert.  Psychiatric:        Behavior: Behavior normal.        Thought Content: Thought content normal.     ED Results / Procedures / Treatments   Labs (all labs ordered are listed, but only abnormal results are displayed) Labs Reviewed  URINALYSIS, ROUTINE W REFLEX MICROSCOPIC - Abnormal; Notable for the following components:      Result Value   Specific Gravity, Urine >1.030 (*)    Hgb urine dipstick SMALL (*)    Protein, ur 100 (*)    All other components within normal limits  URINALYSIS, MICROSCOPIC (REFLEX) - Abnormal; Notable for the following components:   Bacteria, UA FEW (*)    All other components within normal limits  CBC WITH DIFFERENTIAL/PLATELET - Abnormal; Notable for the following components:   WBC 10.6 (*)    Neutro Abs 8.4 (*)    All other components within normal limits  BASIC METABOLIC PANEL - Abnormal; Notable for the following components:   Sodium 134 (*)    Glucose, Bld 112 (*)    All other components within normal limits    EKG None  Radiology No results found.   CT Renal Stone Study  Result Date: 06/21/2020 CLINICAL DATA:  Left flank pain EXAM: CT ABDOMEN AND PELVIS WITHOUT CONTRAST TECHNIQUE: Multidetector CT imaging of the abdomen and pelvis was performed following the standard  protocol without IV contrast. COMPARISON:  None. FINDINGS: Lower chest: Left-sided gynecomastia. Included lung bases are clear. Heart size is normal. Hepatobiliary: No focal liver abnormality is seen. No gallstones, gallbladder wall thickening, or biliary dilatation. Pancreas: Unremarkable. No pancreatic ductal dilatation or surrounding inflammatory changes. Spleen: Normal in size without focal abnormality. Adrenals/Urinary Tract: 2.2 cm indeterminate density left adrenal gland nodule. Unremarkable right adrenal gland. Simple cyst at the lower pole  of the left kidney measuring approximately 3.2 cm. At the posterior aspect of the upper pole the left kidney is a subtle contour deformity with relatively increased attenuation (series 2, images 29-30) measuring approximately 1.3 cm in diameter possibly reflecting a hyperdense cyst versus a solid mass. Duplicated left renal collecting system. Right kidney has a normal noncontrast appearance. No renal stone or hydronephrosis of either kidney. Ureters are nondilated. No ureteral calculi. Urinary bladder appears normal for the degree of distension. Stomach/Bowel: Stomach is within normal limits. Appendix is dilated and air-filled measuring 11 mm in diameter (series 6, image 53). No periappendiceal fat stranding or fluid. No evidence of bowel wall thickening, distention, or inflammatory changes. Vascular/Lymphatic: No significant vascular findings are present. No enlarged abdominal or pelvic lymph nodes. Reproductive: Prostate is unremarkable. Other: No free fluid. No abdominopelvic fluid collection. No pneumoperitoneum. No abdominal wall hernia. 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 No acute osseous findings. IMPRESSION: 1. No evidence of obstructive uropathy. 2. At the posterior aspect of the upper pole the left kidney is a subtle convexity with relatively increased attenuation. Findings may represent a hyperdense cyst versus a solid mass. Further  evaluation with renal ultrasound is recommended. 3. Duplicated left renal collecting system. 4. Appendix is mildly dilated without surrounding inflammatory changes. Findings are felt to be incidental. 5. 2.2 cm indeterminate density left adrenal gland nodule. Further evaluation with nonemergent contrast-enhanced adrenal protocol CT is recommended. This recommendation follows ACR consensus guidelines: Management of Incidental Adrenal Masses: A White Paper of the ACR Incidental Findings Committee. J Am Coll Radiol 2017;14:1038-1044. 6. 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 Electronically Signed   By: Duanne Guess D.O.   On: 06/21/2020 13:40    Procedures Procedures (including critical care time)  Medications Ordered in ED Medications  HYDROmorphone (DILAUDID) injection 1 mg (1 mg Intravenous Given 06/21/20 1228)  ketorolac (TORADOL) 15 MG/ML injection 15 mg (15 mg Intravenous Given 06/21/20 1226)  ondansetron (ZOFRAN) injection 4 mg (4 mg Intravenous Given 06/21/20 1223)    ED Course  I have reviewed the triage vital signs and the nursing notes.  Pertinent labs & imaging results that were available during my care of the patient were reviewed by me and considered in my medical decision making (see chart for details).    MDM Rules/Calculators/A&P                          49 year old male with left flank pain of unclear etiology.  No ureteral stone.  Patient/significant other informed of incidental findings.  Clinically doubt appendicitis.  Symptomatic treatment.  Return precautions discussed.  Final Clinical Impression(s) / ED Diagnoses Final diagnoses:  Left flank pain    Rx / DC Orders ED Discharge Orders    None       Raeford Razor, MD 06/26/20 [phone]

## 2021-11-26 IMAGING — CT CT RENAL STONE PROTOCOL
2 of 4 series · 16 of 46 positions shown, 18 images · non-contrast
Comparison: None.

CLINICAL DATA: Left flank pain

EXAM:
CT ABDOMEN AND PELVIS WITHOUT CONTRAST
TECHNIQUE: Multidetector CT imaging of the abdomen and pelvis was performed
following the standard protocol without IV contrast.

[Series 2: axial st · axial · 0.98mm/px · z∈[-518,-23]mm · 13 of 109 slices shown, 15 images]
[im 5/109  soft-tissue]
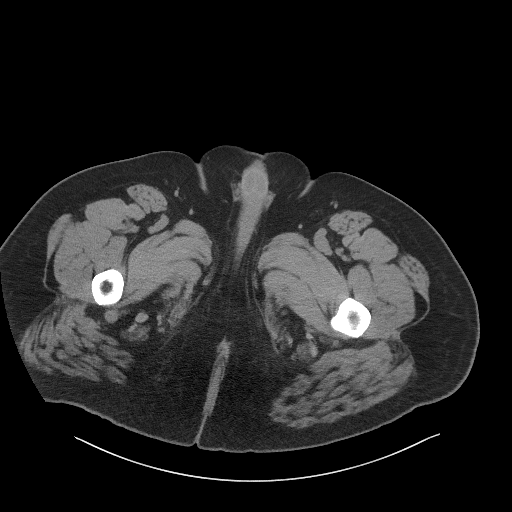
[im 5/109  bone]
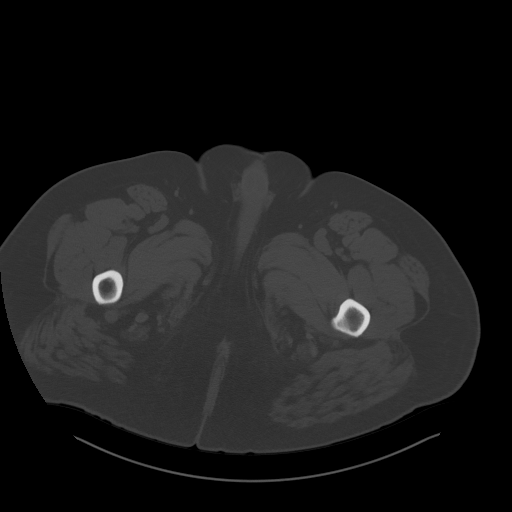
[im 13/109  soft-tissue]
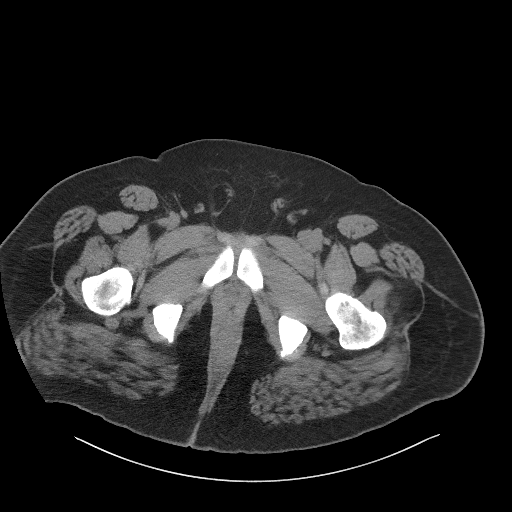
[im 22/109  soft-tissue]
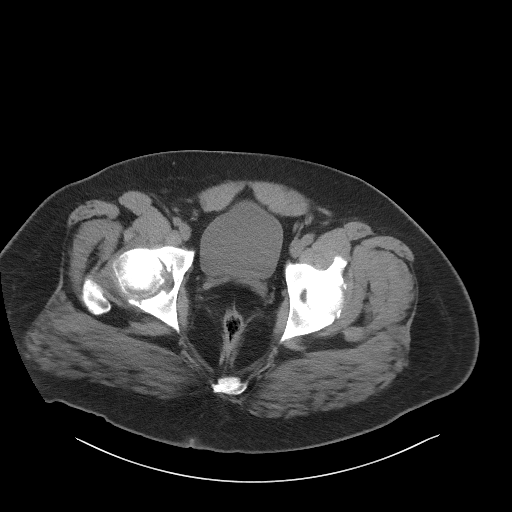
[im 31/109  soft-tissue]
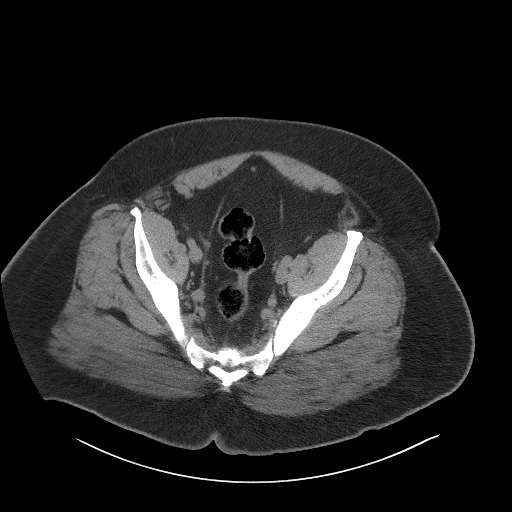
[im 39/109  soft-tissue]
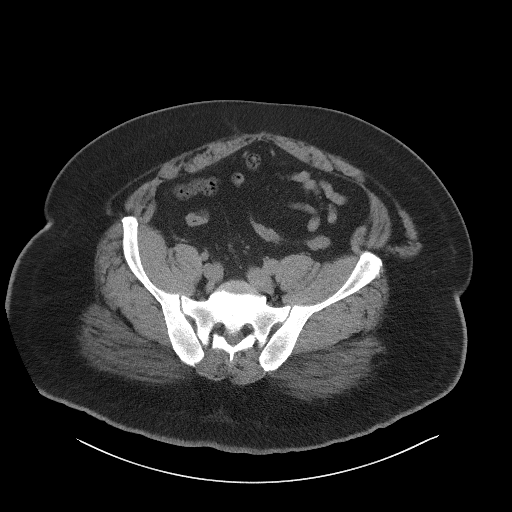
[im 48/109  soft-tissue]
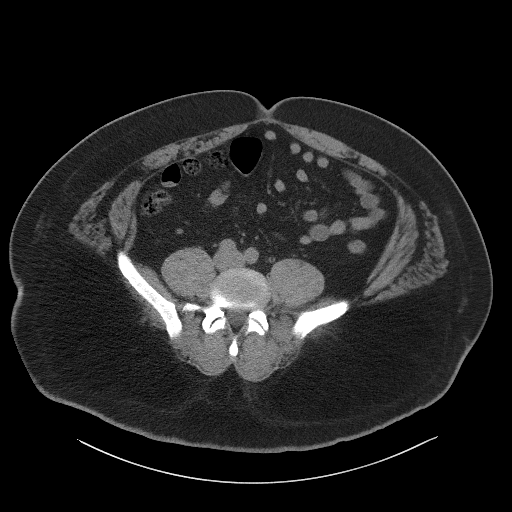
[im 57/109  soft-tissue]
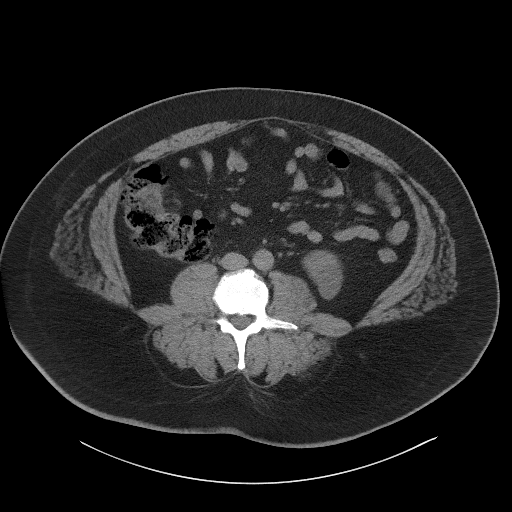
[im 61/109  soft-tissue]
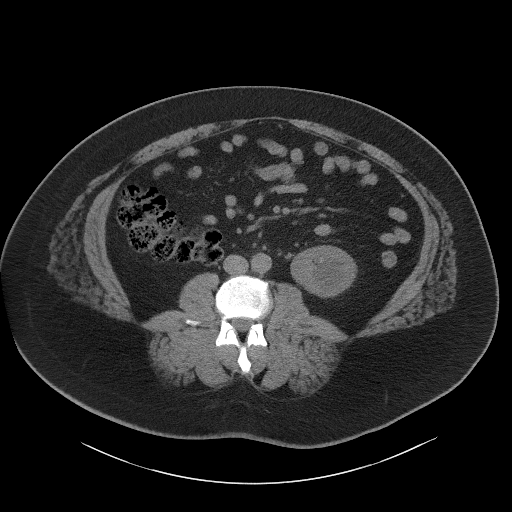
[im 70/109  soft-tissue]
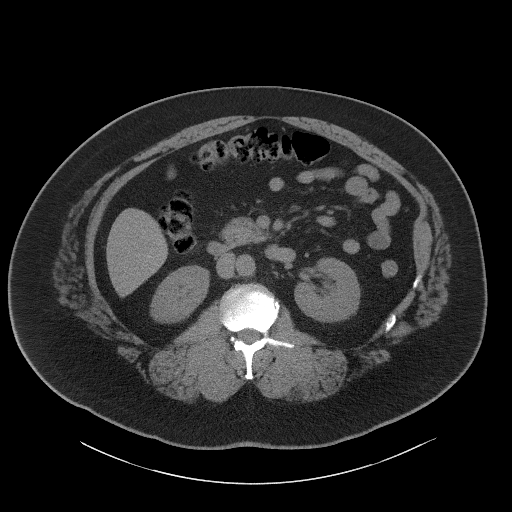
[im 70/109  bone]
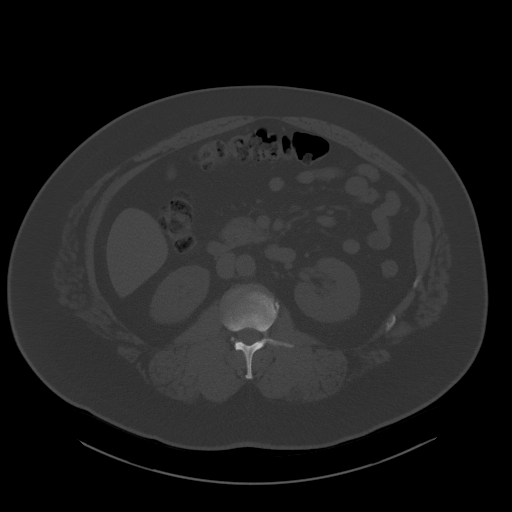
[im 78/109  soft-tissue]
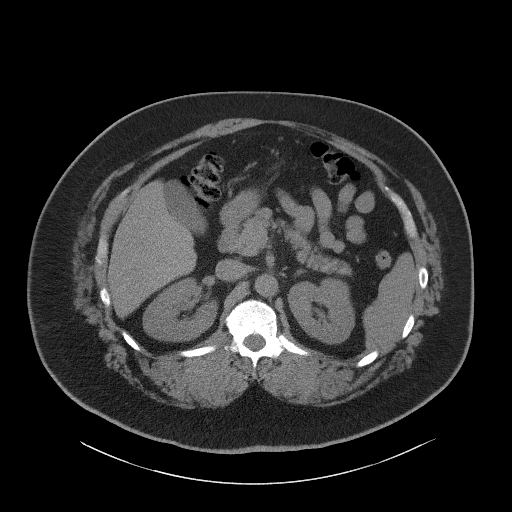
[im 87/109  soft-tissue]
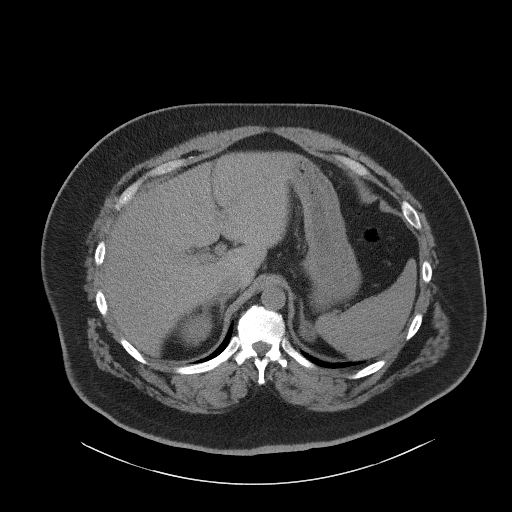
[im 96/109  soft-tissue]
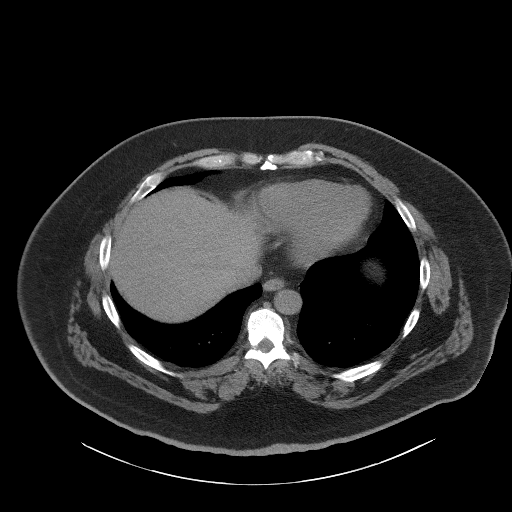
[im 104/109  soft-tissue]
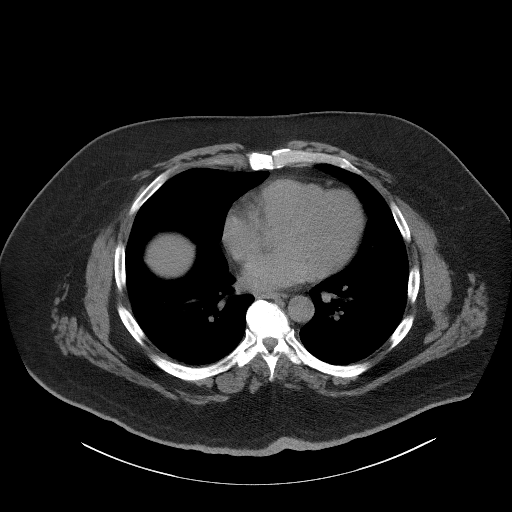

[Series 5: coronal st · coronal · 1.02mm/px · 3 of 122 slices shown]
[im 41/122  soft-tissue]
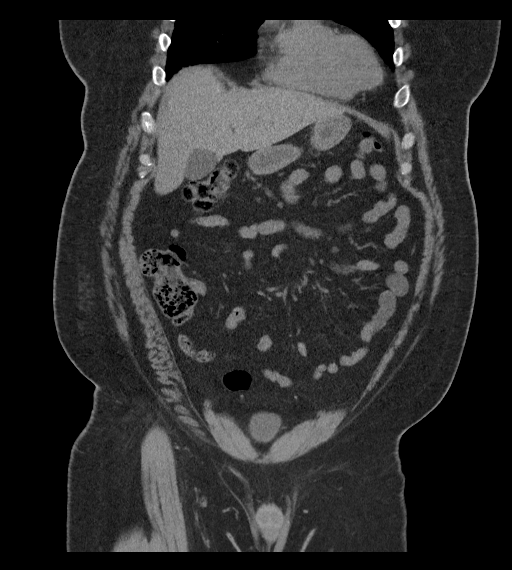
[im 54/122  soft-tissue]
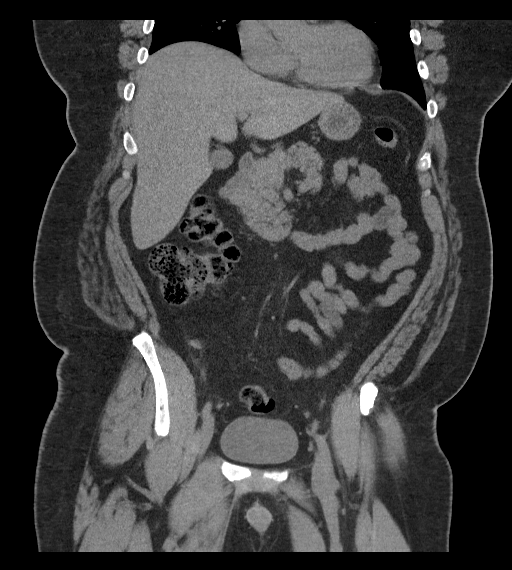
[im 68/122  soft-tissue]
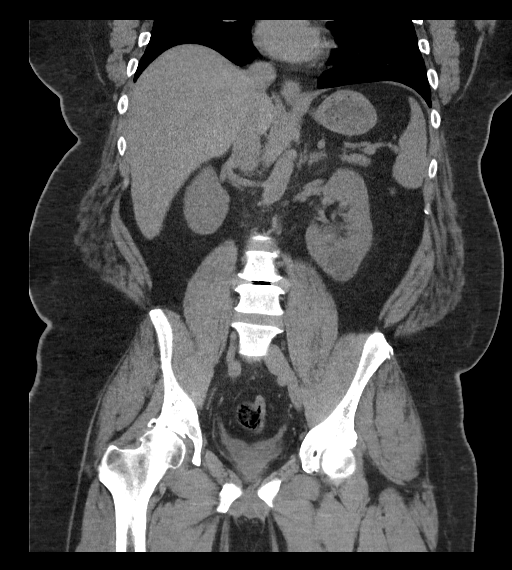

[16 of 46 positions shown; findings below may reference images not displayed]

FINDINGS: Lower chest: Left-sided gynecomastia. Included lung bases are clear.
Heart size is normal.

Hepatobiliary: No focal liver abnormality is seen. No gallstones,
gallbladder wall thickening, or biliary dilatation.

Pancreas: Unremarkable. No pancreatic ductal dilatation or
surrounding inflammatory changes.

Spleen: Normal in size without focal abnormality.

Adrenals/Urinary Tract: 2.2 cm indeterminate density left adrenal
gland nodule. Unremarkable right adrenal gland. Simple cyst at the
lower pole of the left kidney measuring approximately 3.2 cm. At the
posterior aspect of the upper pole the left kidney is a subtle
contour deformity with relatively increased attenuation (series 2,
images 29-30) measuring approximately 1.3 cm in diameter possibly
reflecting a hyperdense cyst versus a solid mass. Duplicated left
renal collecting system. Right kidney has a normal noncontrast
appearance. No renal stone or hydronephrosis of either kidney.
Ureters are nondilated. No ureteral calculi. Urinary bladder appears
normal for the degree of distension.

Stomach/Bowel: Stomach is within normal limits. Appendix is dilated
and air-filled measuring 11 mm in diameter (series 6, image 53). No
periappendiceal fat stranding or fluid. No evidence of bowel wall
thickening, distention, or inflammatory changes.

Vascular/Lymphatic: No significant vascular findings are present. No
enlarged abdominal or pelvic lymph nodes.

Reproductive: Prostate is unremarkable.

Other: No free fluid. No abdominopelvic fluid collection. No
pneumoperitoneum. No abdominal wall hernia.

Musculoskeletal: Severe degenerative changes of the right hip. No
acute osseous findings.
IMPRESSION: 1. No evidence of obstructive uropathy.
2. At the posterior aspect of the upper pole the left kidney is a
subtle convexity with relatively increased attenuation. Findings may
represent a hyperdense cyst versus a solid mass. Further evaluation
with renal ultrasound is recommended.
3. Duplicated left renal collecting system.
4. Appendix is mildly dilated without surrounding inflammatory
changes. Findings are felt to be incidental.
5. 2.2 cm indeterminate density left adrenal gland nodule. Further
evaluation with nonemergent contrast-enhanced adrenal protocol CT is
recommended. This recommendation follows ACR consensus guidelines:
Management of Incidental Adrenal Masses: A White Paper of the ACR
Incidental Findings Committee. [HOSPITAL] [id number].
6. Severe degenerative changes of the right hip.

## 2021-12-10 ENCOUNTER — Ambulatory Visit: Payer: 59 | Admitting: Allergy

## 2022-01-16 NOTE — Progress Notes (Signed)
? ?NEW PATIENT ?Date of Service/Encounter:  01/19/22 ?Referring provider: [facility] ?Primary care provider: [facility] ? ?Subjective:  ?Ricky Gutierrez is a 51 y.o. male with a PMHx of GERD, obesity, pituitary micro adenoma presenting today for evaluation of chronic cough, history of asthma and allergic rhinitis. ?History obtained from: chart review and patient and wife . ? ?Chronic cough:  ?He was seen in 2018 by Dr. Dellis Anes.  At that time complaining of a cough, suspected to be uncontrolled asthma.  He was given a sample of Fasenra.  He was noted to have wheezing on exam which improved with albuterol.  His cough reportedly resolved following 1 injection of Fasenra.  He had been controlled until this past November.  His eosinophil count was elevated prior to that visit. ?His 2018 skin allergy testing was positive to trees, weeds, grasses, molds, dust mites, cat, dog, cockroach. ? ?He was previously also followed by Dr. Sherene Sires.  Has been prescribed gabapentin 100 mg 3 times a day in the past.  He no longer takes this medication and does not remember if it was helpful.   ?He has seen a Pulmonologist at [facility] and was prescribed steroids and Symbicort.  He is unsure if they are helping, but he does continue on Symbicort 160, 2 puffs twice a day. ? ?This past Fall he started coughing again and cough has not been controlled since.   ?He has seen PCP, UC for cough.  He has also been seen again by Pulmonary.   ?Symptoms include: Wheezing at night time, cough triggered by smells-cleaning supplies, perfumes, cooking smells. Cough is a mixture of dry, and wet.   ?When he is coughing a lot, he does get shortness of breath.  He had a syncopal episode in 2018 from coughing. ?Changing positions makes his cough worse (laying down from sitting) or just when he is laying in bed at night.   ? ?He was diagnosed with a COVID-19 infection in 12/2021. ?He has been treated with several  z-packs.  He has had around 6 rounds of oral steroids.   ?He has also had a steroid injection. Around one course of steroids per month since November. ?He has tried different cough medications which are not helping. ?Steroids temporarily help with the cough.   ?He is not taking gabapentin at this point. ?They are open to any suggestions. ? ?He does have reflux and takes omepraozle 40 mg daily.   ?  ?Most recent allergy testing from [facility] ?-most recent on 11/26/21-positive to grasses (Johnson, perennial rye, Timothy) trees (white alder, birch, Box Elder, cedar, Sycamore, elm, mulberry, oak, pecan, Privet), weeds (ragweed, sagebrush), molds (Alternaria penicillium) mixed feathers, cattle, dog, horse, mouse ?AEC WBC 8.1 at 7% = 567 on 10/29/21 in [facility] Records ?CXR: 11/21/21: ADDITIONAL FINDINGS: No abnormality identified.  ?Both films are markedly underpenetrated.  ? ?He has fish allergy listed, but this was not addressed today. ? ?Past Medical History: ?Past Medical History:  ?Diagnosis Date  ? Chronic cough   ? Food allergy   ? GERD (gastroesophageal reflux disease)   ? Reactive airway disease   ? ?Medication List:  ?Current Outpatient Medications  ?Medication Sig Dispense Refill  ? albuterol (VENTOLIN HFA) 108 (90 Base) MCG/ACT inhaler Inhale 2 puffs into the lungs every 6 (six) hours as needed for wheezing or shortness of breath.    ? fluticasone (FLONASE) 50 MCG/ACT nasal spray Place into both nostrils daily.    ? levocetirizine (XYZAL) 5  MG tablet 1 tablet by mouth once daily for itchy eyes. 34 tablet 5  ? MAGNESIUM CITRATE PO Take by mouth.    ? montelukast (SINGULAIR) 10 MG tablet 1 tablet by mouth once each evening for coughing or wheezing. 34 tablet 5  ? omeprazole (PRILOSEC) 40 MG capsule Take 1 capsule (40 mg total) by mouth daily. 30 capsule 11  ? promethazine (PHENERGAN) 6.25 MG/5ML syrup Take by mouth every 6 (six) hours as needed for nausea or vomiting.    ? SYMBICORT 160-4.5 MCG/ACT  inhaler Inhale into the lungs.    ? Vitamin D, Ergocalciferol, (DRISDOL) 1.25 MG (50000 UNIT) CAPS capsule Take 50,000 Units by mouth once a week.    ? ?No current facility-administered medications for this visit.  ? ?Known Allergies:  ?Allergies  ?Allergen Reactions  ? Fish Allergy   ? Codeine Nausea Only  ? ?Past Surgical History: ?Past Surgical History:  ?Procedure Laterality Date  ? HERNIA REPAIR    ? ?Family History: ?Family History  ?Problem Relation Age of Onset  ? Allergic rhinitis Sister   ? ?Social History: Ricky Gutierrez lives in a house built 40 years ago, no water damage, carpet floors, heat pump heating, central AC, pet dog, not using dust mite protection, no smoke exposure.  He is an Librarian, academicexecutive director working in an office, + HEPA filter in the home, no previous smoking history.  He was around secondhand smoke as a young child.  ? ?ROS:  ?All other systems negative except as noted per HPI. ? ?Objective:  ?Blood pressure (!) 164/98, pulse 76, temperature (!) 97.2 ?F (36.2 ?C), temperature source Temporal, resp. rate 20, height 5\' 11"  (1.803 m), weight (!) 333 lb 4.8 oz (151.2 kg), SpO2 98 %. ?Body mass index is 46.49 kg/m?Marland Kitchen. ?Physical Exam: ? ?General Appearance:  Alert, cooperative, no distress, appears stated age  ?Head:  Normocephalic, without obvious abnormality, atraumatic  ?Eyes:  Conjunctiva clear, EOM's intact  ?Nose: Nares normal, hypertrophic turbinates, normal mucosa, no visible anterior polyps, and septum midline  ?Throat: Lips, tongue normal; teeth and gums normal, normal posterior oropharynx and tonsils 2+  ?Neck: Supple, symmetrical  ?Lungs:   clear to auscultation bilaterally, Respirations unlabored, intermittent dry coughing  ?Heart:  regular rate and rhythm and no murmur, Appears well perfused  ?Extremities: No edema  ?Skin: Skin color, texture, turgor normal, no rashes or lesions on visualized portions of skin  ?Neurologic: No gross deficits  ? ? ? ?Diagnostics: ?Spirometry:  ?Tracings  reviewed. His effort: Variable effort-results affected. ?FVC: 2.99L (pre), 2.79L  (post) ?FEV1: 2.43L, 72% predicted (pre), 2.31L, 69% predicted (post) ?FEV1/FVC ratio: 103% (pre), 105% (post) ?Interpretation:  nonobstructive pattern, possible restriction  with out bronchodilator response ? ?Assessment and Plan  ?Patient with a history of chronic cough which responded years ago to [place].  He had been doing well until fall 2022.  I suspect an upper airway component to his cough with possible lower airway involvement.  He does have elevated eosinophils so we discussed another trial of Fasenra for eosinophilic asthma vs bronchitis.Marland Kitchen. ?He has known seasonal and perennial allergic rhinitis which we have addressed.  Additionally has reflux which is likely also contributing.  We will increase his reflux medications at this time to see if this is helpful.  ?We also discussed potential for neurogenic cough component, but will hold on treating this until we have documented responsiveness to upper airway and lower airway inflammation medications. ? ?Chronic cough:  ?Suspect multiple factors contributing including Reflux, Environmental  Allergies, and lower airway hyper responsiveness based on prior response to medications ? ?Lower airway component:- your lung testing today showed a nonobstructive pattern without significant response to albuterol ?- Controller Inhaler: Continue Symbicort 160 mcg 2 puffs twice a day; This Should Be Used Everyday ?- Rinse mouth out after use ?- continue Singulair 10 mg nightly ?- Rescue Inhaler: Albuterol (Proair/Ventolin) 2 puffs . Use  every 4-6 hours as needed for chest tightness, wheezing, or coughing.  Can also use 15 minutes prior to exercise if you have symptoms with activity. ?- Asthma is not controlled if: ? - Symptoms are occurring >2 times a week OR ? - >2 times a month nighttime awakenings ? - You are requiring systemic steroids (prednisone/steroid injections) more than once per  year ? - Your require hospitalization for your asthma. ? - Please call the clinic to schedule a follow up if these symptoms arise ?- sample Fasenra given in clinic today, you will hear form our Biologics coordinator Tam

## 2022-01-19 ENCOUNTER — Ambulatory Visit: Payer: 59 | Admitting: Internal Medicine

## 2022-01-19 ENCOUNTER — Encounter: Payer: Self-pay | Admitting: Internal Medicine

## 2022-01-19 ENCOUNTER — Other Ambulatory Visit: Payer: Self-pay

## 2022-01-19 VITALS — BP 158/106 | HR 76 | Temp 97.2°F | Resp 20 | Ht 71.0 in | Wt 333.3 lb

## 2022-01-19 DIAGNOSIS — J45991 Cough variant asthma: Secondary | ICD-10-CM

## 2022-01-19 DIAGNOSIS — J3089 Other allergic rhinitis: Secondary | ICD-10-CM | POA: Diagnosis not present

## 2022-01-19 DIAGNOSIS — J4540 Moderate persistent asthma, uncomplicated: Secondary | ICD-10-CM

## 2022-01-19 DIAGNOSIS — J302 Other seasonal allergic rhinitis: Secondary | ICD-10-CM

## 2022-01-19 DIAGNOSIS — J4550 Severe persistent asthma, uncomplicated: Secondary | ICD-10-CM | POA: Diagnosis not present

## 2022-01-19 MED ORDER — BENRALIZUMAB 30 MG/ML ~~LOC~~ SOSY
30.0000 mg | PREFILLED_SYRINGE | Freq: Once | SUBCUTANEOUS | Status: AC
  Administered 2022-01-19: 30 mg via SUBCUTANEOUS

## 2022-01-19 MED ORDER — ALBUTEROL SULFATE HFA 108 (90 BASE) MCG/ACT IN AERS: 2.0000 | INHALATION_SPRAY | Freq: Four times a day (QID) | RESPIRATORY_TRACT | 1 refills | Status: DC | PRN

## 2022-01-19 MED ORDER — MONTELUKAST SODIUM 10 MG PO TABS: ORAL_TABLET | ORAL | 1 refills | Status: DC

## 2022-01-19 MED ORDER — OMEPRAZOLE 40 MG PO CPDR: DELAYED_RELEASE_CAPSULE | ORAL | 1 refills | Status: DC

## 2022-01-19 MED ORDER — FLUTICASONE PROPIONATE 50 MCG/ACT NA SUSP: NASAL | 1 refills | Status: DC

## 2022-01-19 NOTE — Patient Instructions (Addendum)
Chronic cough:  ?Suspect multiple factors contributing including Reflux, Environmental Allergies, and lower airway hyper responsiveness ? ?Lower airway component:- your lung testing today showed a nonobstructive pattern without significant response to albuterol ?- Controller Inhaler: Continue Symbicort 160 mcg 2 puffs twice a day; This Should Be Used Everyday ?- Rinse mouth out after use ?- continue Singulair 10 mg nightly ?- Rescue Inhaler: Albuterol (Proair/Ventolin) 2 puffs . Use  every 4-6 hours as needed for chest tightness, wheezing, or coughing.  Can also use 15 minutes prior to exercise if you have symptoms with activity. ?- Asthma is not controlled if: ? - Symptoms are occurring >2 times a week OR ? - >2 times a month nighttime awakenings ? - You are requiring systemic steroids (prednisone/steroid injections) more than once per year ? - Your require hospitalization for your asthma. ? - Please call the clinic to schedule a follow up if these symptoms arise ?- sample Fasenra given in clinic today, you will hear form our Biologics coordinator Ocie Bob about getting this approved ? ?. Reflux component: increase omeprazole 40 mg twice a day, take 20 minutes prior to your meals ? ?- allergic rhinitis component:  ? - xyzal 5 mg daiy ? - flonase 2 sprays in each nostril daily. ? - allergen avoidance ? ?May consider treating neurogenic component with gabapentin if no response to the above medications at your follow-up visit  ? ?It was pleasure meeting you and your wife today! ?Follow-up in 4 to 6 weeks, sooner if needed.   ? ?Reducing Pollen Exposure ? ?The American Academy of Allergy, Asthma and Immunology suggests the following steps to reduce your exposure to pollen during allergy seasons. ?   ?Do not hang sheets or clothing out to dry; pollen may collect on these items. ?Do not mow lawns or spend time around freshly cut grass; mowing stirs up pollen. ?Keep windows closed at night.  Keep car windows closed  while driving. ?Minimize morning activities outdoors, a time when pollen counts are usually at their highest. ?Stay indoors as much as possible when pollen counts or humidity is high and on windy days when pollen tends to remain in the air longer. ?Use air conditioning when possible.  Many air conditioners have filters that trap the pollen spores. ?Use a HEPA room air filter to remove pollen form the indoor air you breathe. ? ?Control of Dog or Cat Allergen ? ?Avoidance is the best way to manage a dog or cat allergy. If you have a dog or cat and are allergic to dog or cats, consider removing the dog or cat from the home. ?If you have a dog or cat but don?t want to find it a new home, or if your family wants a pet even though someone in the household is allergic, here are some strategies that may help keep symptoms at bay: ? ?Keep the pet out of your bedroom and restrict it to only a few rooms. Be advised that keeping the dog or cat in only one room will not limit the allergens to that room. ?Don?t pet, hug or kiss the dog or cat; if you do, wash your hands with soap and water. ?High-efficiency particulate air (HEPA) cleaners run continuously in a bedroom or living room can reduce allergen levels over time. ?Regular use of a high-efficiency vacuum cleaner or a central vacuum can reduce allergen levels. ?Giving your dog or cat a bath at least once a week can reduce airborne allergen. ? ?Control of Cockroach  Allergen ? ?Cockroach allergen has been identified as an important cause of acute attacks of asthma, especially in urban settings.  There are fifty-five species of cockroach that exist in the [place], however only three, the [place], [place] species produce allergen that can affect patients with Asthma.  Allergens can be obtained from fecal particles, egg casings and secretions from cockroaches. ?   ?Remove food sources. ?Reduce access to water. ?Seal access and entry points. ?Spray runways with  0.5-1% Diazinon or Chlorpyrifos ?Blow boric acid power under stoves and refrigerator. ?Place bait stations (hydramethylnon) at feeding sites. ? ?Control of Mold Allergen  ? ?Mold and fungi can grow on a variety of surfaces provided certain temperature and moisture conditions exist.  Outdoor molds grow on plants, decaying vegetation and soil.  The major outdoor mold, Alternaria and Cladosporium, are found in very high numbers during hot and dry conditions.  Generally, a late Summer - Fall peak is seen for common outdoor fungal spores.  Rain will temporarily lower outdoor mold spore count, but counts rise rapidly when the rainy period ends.  The most important indoor molds are Aspergillus and Penicillium.  Dark, humid and poorly ventilated basements are ideal sites for mold growth.  The next most common sites of mold growth are the bathroom and the kitchen. ? ?Outdoor (Seasonal) Mold Control ? ?Use air conditioning and keep windows closed ?Avoid exposure to decaying vegetation. ?Avoid leaf raking. ?Avoid grain handling. ?Consider wearing a face mask if working in moldy areas.  ? ? ?Indoor (Perennial) Mold Control  ? ?Maintain humidity below 50%. ?Clean washable surfaces with 5% bleach solution. ?Remove sources e.g. contaminated carpets. ? ? ? ?

## 2022-01-23 ENCOUNTER — Telehealth: Payer: Self-pay

## 2022-01-23 NOTE — Telephone Encounter (Signed)
[name] informed me to start the biologic for pt she needs a updated cbc please advise to lab order?  ?

## 2022-01-26 ENCOUNTER — Telehealth: Payer: Self-pay | Admitting: *Deleted

## 2022-01-26 NOTE — Telephone Encounter (Signed)
L/m for patient to reach out to discuss Harrington Challenger for his asthma and if he wants to get injs in clinic or self admin ?

## 2022-01-26 NOTE — Telephone Encounter (Signed)
-----   Message from Tonny Bollman, MD sent at 01/19/2022  5:50 PM EDT ----- ?Hi Guiselle Mian.  Can we try to get him approved for Harrington Challenger?  He had an absolute eosinophil count of greater than 500 on his paperwork from [place].  I will try to get that scanned to chart, but have put it in my note. ?

## 2022-01-29 ENCOUNTER — Ambulatory Visit: Payer: 59 | Admitting: Dietician

## 2022-01-30 NOTE — Telephone Encounter (Signed)
Patient called and I returned call back to him and l/m to call me ?

## 2022-02-05 NOTE — Telephone Encounter (Signed)
Spoke to patient and advised we are going to try and get Harrington Challenger from the denied savings program since his medical does not cover and his pharmacy has excluded drug from coverage ?

## 2022-02-17 ENCOUNTER — Ambulatory Visit: Payer: 59

## 2022-03-25 ENCOUNTER — Ambulatory Visit: Payer: 59 | Admitting: Dietician

## 2022-04-08 NOTE — Telephone Encounter (Signed)
L/m advising patient still working on getting Berna Bue from denied savings program. The PAP program is requiring on top of pharmacy and medical denial a pharmacy appeal denial. Will  let him know when I get same and hopefully can move forward

## 2022-04-08 NOTE — Telephone Encounter (Signed)
Pt called today wondering about this and if he can still get on fasnera

## 2022-04-24 NOTE — Progress Notes (Signed)
FOLLOW UP Date of Service/Encounter:  04/27/22   Subjective:  Ricky Gutierrez (DOB: 14-Sep-1971) is a 51 y.o. male PMHx of GERD, obesity, pituitary micro adenoma who [facility] in re-evaluation of the following: chronic cough, allergic rhinitis History obtained from: chart review and patient.  For Review, LV was on 01/19/22  with Dr.Jamar Casagrande seen for intial visit for chronic cough and allergic rhinitis . Chronic cough restarted in fall 2022 and uncontrolled.  Symptoms include: Wheezing at night time, cough triggered by smells-cleaning supplies, perfumes, cooking smells. Cough is a mixture of dry, and wet.   We gave a Fasenra sample, but were unable to get approved by insurance.  This is still being denied.  We increaesd his omeprazole to twice daily. Discussed consideration of restarting gabapentin if no improvement with above.  ---------------------- Today presents for follow-up. He continues to have random cough, it is much improved since his initial visit but still occurring.  His Harrington Challenger has been denied, and it is in the appeal process. Cough now is happening much less than before.  It is only happening once every couple of weeks, and he will cough for hours at a time.  Sometimes he may be eating something or will be triggered by a strong smell of something.  A few times has happened prior to bed.  Once it was in the morning on the way to work.  No shortness of breath.  Occasionally gets a tickle in his throat and will start coughing.   Smells seem to be a recurrent issue.  He is not using the Symbicort because he ran out a few months ago.  He is not sure if this helps.  He can't really tell if it made a difference when he came off.  He is taking singulair, xyzal and flonase daily. He is on his last bottle of flonase.  He does not feel he has a lot of drainage.  His albuterol once during one of the coughing episodes and it proved to be very  helpful.  After 2 puffs, his cough subsided within the next half hour. ------------------------ Pertinent History/Diagnostics:  - Chronic cough: seen in 2018 by Dr. Dellis Anes.  At that time complaining of a cough, suspected to be uncontrolled asthma.  He was given a sample of Fasenra.  He was noted to have wheezing on exam which improved with albuterol.  His cough reportedly resolved following 1 injection of Fasenra. Cough restarted fall 2022. Covid 19 02/20223; no improvement with multiple z-packs. Has reflux, takes omperazole 40 mg daily.  -  previously also followed by Dr. Sherene Sires.  Has been prescribed gabapentin 100 mg 3 times a day in the past.. No longer taking, unclear if helpful.  - seeing Pulmonologist at [facility] medical-prescribed steroids and Symbicort 160, unclear if helping - AEC WBC 8.1 at 7% = 567 on 10/29/21 in [facility] -CXR: 11/21/21: No abnormality identified.  - 01/19/21 spirometry - nonobstructive pattern ratio 103%, possible restriction  with out bronchodilator response   - Allergic Rhinitis:   - SPT environmental panel 2018 skin allergy testing was positive to trees, weeds, grasses, molds, dust mites, cat, dog, cockroach.  - -11/26/21-positive to grasses (Johnson, perennial rye, Timothy) trees (white alder, birch, Box Elder, cedar, Sycamore, elm, mulberry, oak, pecan, Privet), weeds (ragweed, sagebrush), molds (Alternaria penicillium) mixed feathers, cattle, dog, horse, mouse   Allergies as of 04/27/2022       Reactions   Fish Allergy  Codeine Nausea Only        Medication List        Accurate as of April 27, 2022  4:45 PM. If you have any questions, ask your nurse or doctor.          STOP taking these medications    promethazine 6.25 MG/5ML syrup Commonly known as: PHENERGAN Stopped by: Verlee Monte, MD   Vitamin D (Ergocalciferol) 1.25 MG (50000 UNIT) Caps capsule Commonly known as: DRISDOL Stopped by: Verlee Monte, MD       TAKE  these medications    albuterol 108 (90 Base) MCG/ACT inhaler Commonly known as: VENTOLIN HFA Inhale 2 puffs into the lungs every 6 (six) hours as needed for wheezing or shortness of breath.   fluticasone 50 MCG/ACT nasal spray Commonly known as: FLONASE 2 sprays per nostril once daily as needed for stuffy nose   levocetirizine 5 MG tablet Commonly known as: XYZAL 1 tablet by mouth once daily for itchy eyes.   MAGNESIUM CITRATE PO Take by mouth.   montelukast 10 MG tablet Commonly known as: Singulair One tablet once daily at night for coughing or wheezing.   omeprazole 40 MG capsule Commonly known as: PRILOSEC Take one capsule twice daily as needed for reflux   Symbicort 160-4.5 MCG/ACT inhaler Generic drug: budesonide-formoterol Inhale into the lungs.       Past Medical History:  Diagnosis Date   Chronic cough    Food allergy    GERD (gastroesophageal reflux disease)    Reactive airway disease    Past Surgical History:  Procedure Laterality Date   HERNIA REPAIR     Otherwise, there have been no changes to his past medical history, surgical history, family history, or social history.  ROS: All others negative except as noted per HPI.   Objective:  BP 118/74   Pulse 74   Temp 98.1 F (36.7 C) (Temporal)   Resp 18   SpO2 96%  There is no height or weight on file to calculate BMI. Physical Exam: General Appearance:  Alert, cooperative, no distress, appears stated age  Head:  Normocephalic, without obvious abnormality, atraumatic  Eyes:  Conjunctiva clear, EOM's intact  Nose: Nares normal, hypertrophic turbinates, normal mucosa, and no visible anterior polyps  Throat: Lips, tongue normal; teeth and gums normal, normal posterior oropharynx  Neck: Supple, symmetrical  Lungs:   clear to auscultation bilaterally, Respirations unlabored, intermittent dry coughing  Heart:  regular rate and rhythm and no murmur, Appears well perfused  Extremities: No edema  Skin:  Skin color, texture, turgor normal, no rashes or lesions on visualized portions of skin  Neurologic: No gross deficits   Spirometry:  Tracings reviewed. His effort: Variable effort-results affected. FVC: 3.03L FEV1: 2.57L, 76% predicted FEV1/FVC ratio: 108% Interpretation:  nonobstructive ratio .  Please see scanned spirometry results for details.  Assessment/Plan   Chronic cough: Improved, but still active Suspect multiple factors contributing including Reflux, Environmental Allergies, and lower airway hyper responsiveness At this point in time, his symptoms seem mainly triggered by upper airway triggers.  We will continue to be aggressive in treatment with reflux and allergic rhinitis.  I suspect lower airway is not playing a substantial role in symptoms at this time, but he did improve significantly with albuterol during one of the coughing attacks.  We will try this as a rescue medicine during acute attacks.  Query laryngeal spasm.   Lower airway component:- your lung testing today showed a nonobstructive pattern   -  Controller Med: - continue Singulair 10 mg nightly - Rescue Inhaler: Albuterol (Proair/Ventolin) 2 puffs . Use  every 4-6 hours as needed for chest tightness, wheezing, or coughing.  Can also use 15 minutes prior to exercise if you have symptoms with activity. - Cough is not controlled if:  - Symptoms are occurring >2 times a week OR  - >2 times a month nighttime awakenings  - You are requiring systemic steroids (prednisone/steroid injections) more than once per year  - Your require hospitalization for your asthma.  - Please call the clinic to schedule a follow up if these symptoms arise  . Reflux component: continue omeprazole 40 mg twice a day, take 20 minutes prior to your meals  - allergic rhinitis component:   - xyzal 5 mg daiy  - flonase 2 sprays in each nostril daily.  - allergen avoidance  For now, will continue current therapy.  If cough occurs, use  albuterol 2-4 puffs every 4 to 6 hours.  If needing albuterol more than twice a week. Please call to let us know and schedule a follow-up.  It was pleasure seeing you again today. Follow-up in 3-4 months, sooner if needed.   Tonny Bollman, MD  [facility]

## 2022-04-27 ENCOUNTER — Encounter: Payer: Self-pay | Admitting: Internal Medicine

## 2022-04-27 ENCOUNTER — Ambulatory Visit: Payer: 59 | Admitting: Internal Medicine

## 2022-04-27 VITALS — BP 118/74 | HR 74 | Temp 98.1°F | Resp 18

## 2022-04-27 DIAGNOSIS — J4540 Moderate persistent asthma, uncomplicated: Secondary | ICD-10-CM | POA: Diagnosis not present

## 2022-04-27 DIAGNOSIS — J3089 Other allergic rhinitis: Secondary | ICD-10-CM

## 2022-04-27 DIAGNOSIS — J45991 Cough variant asthma: Secondary | ICD-10-CM

## 2022-04-27 DIAGNOSIS — J302 Other seasonal allergic rhinitis: Secondary | ICD-10-CM

## 2022-04-27 DIAGNOSIS — R053 Chronic cough: Secondary | ICD-10-CM

## 2022-04-27 DIAGNOSIS — K219 Gastro-esophageal reflux disease without esophagitis: Secondary | ICD-10-CM

## 2022-04-27 MED ORDER — ALBUTEROL SULFATE HFA 108 (90 BASE) MCG/ACT IN AERS: INHALATION_SPRAY | RESPIRATORY_TRACT | 1 refills | Status: DC

## 2022-04-27 MED ORDER — FLUTICASONE PROPIONATE 50 MCG/ACT NA SUSP: NASAL | 1 refills | Status: DC

## 2022-04-27 MED ORDER — LEVOCETIRIZINE DIHYDROCHLORIDE 5 MG PO TABS: ORAL_TABLET | ORAL | 2 refills | Status: DC

## 2022-04-27 MED ORDER — OMEPRAZOLE 40 MG PO CPDR: 40.0000 mg | DELAYED_RELEASE_CAPSULE | Freq: Two times a day (BID) | ORAL | 1 refills | Status: DC

## 2022-04-27 MED ORDER — MONTELUKAST SODIUM 10 MG PO TABS: ORAL_TABLET | ORAL | 1 refills | Status: DC

## 2022-04-28 ENCOUNTER — Encounter: Payer: Self-pay | Admitting: Internal Medicine

## 2022-07-07 NOTE — Progress Notes (Signed)
FOLLOW UP Date of Service/Encounter:  07/09/22   Subjective:  Ricky Gutierrez (DOB: April 22, 1971) is a 51 y.o. male PMHx of GERD, obesity, pituitary micro adenoma who [facility]22 in re-evaluation of the following:  chronic cough, allergic rhinitis History obtained from: chart review and patient.  For Review, LV was on 04/27/22  with Dr.Dashawna Delbridge seen for routine follow-up.   Pertinent History/Diagnostics:  - Chronic cough: seen in 2018 by Dr. Dellis Anes.  At that time complaining of a cough, suspected to be uncontrolled asthma.  He was given a sample of Fasenra.  He was noted to have wheezing on exam which improved with albuterol.  His cough reportedly resolved following 1 injection of Fasenra. Cough restarted fall 2022. Covid 19 12/2021; no improvement with multiple z-packs. Has reflux, takes omperazole 40 mg daily which was increased to twice daily in March 2023. This improved cough some. We gave another sample of Fasenra March 2023-denied by insurance. Cough suspected to be mostly UACS.                -  previously also followed by Dr. Sherene Sires.  Has been prescribed gabapentin 100 mg 3 times a day in the past.. No longer taking, unclear if helpful.                - seeing Pulmonologist at [facility] medical-prescribed steroids and Symbicort 160, unclear if helping            - AEC WBC 8.1 at 7% = 567 on 10/29/21 in [facility] -CXR: 11/21/21: No abnormality identified.                - 01/19/21 spirometry - nonobstructive pattern ratio 103%, possible restriction  with out bronchodilator response   - Allergic Rhinitis:                 - SPT environmental panel 2018 skin allergy testing was positive to trees, weeds, grasses, molds, dust mites, cat, dog, cockroach.                - -11/26/21-positive to grasses (Johnson, perennial rye, Timothy) trees (white alder, birch, Box Elder, cedar, Sycamore, elm, mulberry, oak, pecan), weeds (ragweed,  sagebrush), molds (Alternaria penicillium) mixed feathers, cattle, dog, horse, mouse  Today presents for follow-up. Overall he is doing okay, he does have occasional flares, and once a flare occurs it seems to persist for a few days then comes down again.  He has had coughing and congestion following ice cream.  This is occurred on 2 separate occasions with ice cream-immediately feels a coat on his throat and stuff comes out.  He believes it is associated with cold which has been a trigger for him in the past. HE is using his rescue inhaler and it helps sometimes during coughing spells. The only effective treatment he has had thus far has been [place] injection which his insurance will not approve. He is pleased with level of control of reflux. He does continue to use his Symbicort 160 mcg 2 puffs twice a day as well as Singulair nightly.  He is using Flonase and Xyzal daily. He does continue to have some drainage. When he does cough up sputum that is thick and green.  This has been going on for several weeks now.  He has not had an antibiotic in some time.   Allergies as of 07/09/2022       Reactions   Fish Allergy  Codeine Nausea Only        Medication List        Accurate as of July 09, 2022  1:54 PM. If you have any questions, ask your nurse or doctor.          albuterol 108 (90 Base) MCG/ACT inhaler Commonly known as: VENTOLIN HFA Use 2 to 4 puffs every 4-6 hours as needed for cough, shortness of breath or wheezing   amoxicillin-clavulanate 875-125 MG tablet Commonly known as: AUGMENTIN Take 1 tablet by mouth 2 (two) times daily for 10 days. Started by: Verlee Monte, MD   fluticasone 50 MCG/ACT nasal spray Commonly known as: FLONASE 2 sprays per nostril once daily as needed for stuffy nose   levocetirizine 5 MG tablet Commonly known as: XYZAL 1 tablet by mouth once daily for itchy eyes.   MAGNESIUM CITRATE PO Take by mouth.   montelukast 10 MG  tablet Commonly known as: Singulair One tablet once daily at night for coughing or wheezing.   omeprazole 40 MG capsule Commonly known as: PRILOSEC Take 1 capsule (40 mg total) by mouth in the morning and at bedtime. Take 30 minutes prior to any meals.   Symbicort 160-4.5 MCG/ACT inhaler Generic drug: budesonide-formoterol Inhale into the lungs.       Past Medical History:  Diagnosis Date   Chronic cough    Food allergy    GERD (gastroesophageal reflux disease)    Reactive airway disease    Past Surgical History:  Procedure Laterality Date   HERNIA REPAIR     Otherwise, there have been no changes to his past medical history, surgical history, family history, or social history.  ROS: All others negative except as noted per HPI.   Objective:  BP 132/70 (BP Location: Left Arm, Patient Position: Sitting, Cuff Size: Large)   Pulse 73   Temp 98.1 F (36.7 C) (Temporal)   Resp 20   Wt (!) 307 lb 4.8 oz (139.4 kg)   SpO2 97%   BMI 42.86 kg/m  Body mass index is 42.86 kg/m. Physical Exam: General Appearance:  Alert, cooperative, no distress, appears stated age  Head:  Normocephalic, without obvious abnormality, atraumatic  Eyes:  Conjunctiva clear, EOM's intact  Nose: Nares normal, hypertrophic turbinates, normal mucosa, no visible anterior polyps, and septum midline  Throat: Lips, tongue normal; teeth and gums normal, normal posterior oropharynx and + cobblestoning  Neck: Supple, symmetrical  Lungs:   clear to auscultation bilaterally, Respirations unlabored, intermittent dry coughing  Heart:  regular rate and rhythm and no murmur, Appears well perfused  Extremities: No edema  Skin: Skin color, texture, turgor normal, no rashes or lesions on visualized portions of skin  Neurologic: No gross deficits   Spirometry:  Tracings reviewed. His effort: Poor effort, data can not be interpreted.  Unable to perform due to coughing. FVC: 2.55L FEV1: 2.03L, 60%  predicted FEV1/FVC ratio: 101% Interpretation:  Overall nonobstructive ratio, showing some restriction but also with coughing at the end of expiration affecting results .  Please see scanned spirometry results for details. Assessment/Plan   Chronic cough: partially controlled Suspect multiple factors contributing including Reflux, Environmental Allergies, and lower airway hyper responsiveness  Lower airway component:- your lung testing today showed a nonobstructive pattern  - Controller Med: continue Symbicort 160 mcg, 2 puffs twice a day.  Try decreasing to once a day. - continue Singulair 10 mg nightly - Rescue Inhaler: Albuterol: (Ventolin) 2 to 4 puffs. Use  every 4-6 hours as  needed for chest tigHtness, wheezing, or coughing.  Can also use 15 minutes prior to exercise if you have symptoms with activity. - Cough is not controlled if:  - Symptoms are occurring >2 times a week OR  - >2 times a month nighttime awakenings  - You are requiring systemic steroids (prednisone/steroid injections) more than once per year  - Your require hospitalization for your asthma.   - Please call the clinic to schedule a follow up if these symptoms arise - will check again with [name] regarding Fasenra  . Reflux component: continue omeprazole 40 mg twice a day, take 20 minutes prior to your meals  - allergic rhinitis component: Suspect sinusitis component  - xyzal 5 mg daiy  - ryaltris 1-2 sprays in each nostril twice daily, sample given today.  - allergen avoidance  -Start Augmentin 875 mg 1 tablet twice daily for 10 days  For now, will continue current therapy.  If cough occurs, use albuterol 2-4 puffs every 4 to 6 hours.  If needing albuterol more than twice a week. Please call to let us know and schedule a follow-up.  It was pleasure seeing you again today. Follow-up in 6 months, sooner if needed.   Tonny Bollman, MD  [facility]

## 2022-07-09 ENCOUNTER — Encounter: Payer: Self-pay | Admitting: Internal Medicine

## 2022-07-09 ENCOUNTER — Ambulatory Visit: Payer: 59 | Admitting: Internal Medicine

## 2022-07-09 VITALS — BP 132/70 | HR 73 | Temp 98.1°F | Resp 20 | Wt 307.3 lb

## 2022-07-09 DIAGNOSIS — J4540 Moderate persistent asthma, uncomplicated: Secondary | ICD-10-CM

## 2022-07-09 DIAGNOSIS — J3089 Other allergic rhinitis: Secondary | ICD-10-CM | POA: Diagnosis not present

## 2022-07-09 DIAGNOSIS — K219 Gastro-esophageal reflux disease without esophagitis: Secondary | ICD-10-CM

## 2022-07-09 DIAGNOSIS — J019 Acute sinusitis, unspecified: Secondary | ICD-10-CM | POA: Diagnosis not present

## 2022-07-09 DIAGNOSIS — J302 Other seasonal allergic rhinitis: Secondary | ICD-10-CM

## 2022-07-09 MED ORDER — ALBUTEROL SULFATE HFA 108 (90 BASE) MCG/ACT IN AERS: INHALATION_SPRAY | RESPIRATORY_TRACT | 1 refills | Status: DC

## 2022-07-09 MED ORDER — AMOXICILLIN-POT CLAVULANATE 875-125 MG PO TABS: 1.0000 | ORAL_TABLET | Freq: Two times a day (BID) | ORAL | 0 refills | Status: AC

## 2022-07-09 NOTE — Patient Instructions (Addendum)
Chronic cough:  Suspect multiple factors contributing including Reflux, Environmental Allergies, and lower airway hyper responsiveness  Lower airway component:- your lung testing today showed a nonobstructive pattern  - Controller Med: continue Symbicort 160 mcg, 2 puffs twice a day.  Try decreasing to once a day. - continue Singulair 10 mg nightly - Rescue Inhaler:  Albuterol: (Ventolin) 2 to 4 puffs . Use  every 4-6 hours as needed for chest tigHtness, wheezing, or coughing.  Can also use 15 minutes prior to exercise if you have symptoms with activity. - Cough is not controlled if:  - Symptoms are occurring >2 times a week OR  - >2 times a month nighttime awakenings  - You are requiring systemic steroids (prednisone/steroid injections) more than once per year  - Your require hospitalization for your asthma.   - Please call the clinic to schedule a follow up if these symptoms arise - will check again with [name] regarding Fasenra  . Reflux component: continue omeprazole 40 mg twice a day, take 20 minutes prior to your meals  - allergic rhinitis component:   - xyzal 5 mg daiy  - ryaltris 1-2 sprays in each nostril twice daily  - allergen avoidance  For now, will continue current therapy.  If cough occurs, use albuterol 2-4 puffs every 4 to 6 hours.  If needing albuterol more than twice a week. Please call to let us know and schedule a follow-up.  It was pleasure seeing you again today. Follow-up in 6 months, sooner if needed.   Tonny Bollman, MD [facility]   Reducing Pollen Exposure  The American Academy of Allergy, Asthma and Immunology suggests the following steps to reduce your exposure to pollen during allergy seasons.    Do not hang sheets or clothing out to dry; pollen may collect on these items. Do not mow lawns or spend time around freshly cut grass; mowing stirs up pollen. Keep windows closed at night.  Keep car windows closed while driving. Minimize  morning activities outdoors, a time when pollen counts are usually at their highest. Stay indoors as much as possible when pollen counts or humidity is high and on windy days when pollen tends to remain in the air longer. Use air conditioning when possible.  Many air conditioners have filters that trap the pollen spores. Use a HEPA room air filter to remove pollen form the indoor air you breathe.  Control of Dog or Cat Allergen  Avoidance is the best way to manage a dog or cat allergy. If you have a dog or cat and are allergic to dog or cats, consider removing the dog or cat from the home. If you have a dog or cat but don't want to find it a new home, or if your family wants a pet even though someone in the household is allergic, here are some strategies that may help keep symptoms at bay:  Keep the pet out of your bedroom and restrict it to only a few rooms. Be advised that keeping the dog or cat in only one room will not limit the allergens to that room. Don't pet, hug or kiss the dog or cat; if you do, wash your hands with soap and water. High-efficiency particulate air (HEPA) cleaners run continuously in a bedroom or living room can reduce allergen levels over time. Regular use of a high-efficiency vacuum cleaner or a central vacuum can reduce allergen levels. Giving your dog or cat a bath at least once  a week can reduce airborne allergen.  Control of Cockroach Allergen  Cockroach allergen has been identified as an important cause of acute attacks of asthma, especially in urban settings.  There are fifty-five species of cockroach that exist in the [place], however only three, the [place], [place] species produce allergen that can affect patients with Asthma.  Allergens can be obtained from fecal particles, egg casings and secretions from cockroaches.    Remove food sources. Reduce access to water. Seal access and entry points. Spray runways with 0.5-1% Diazinon or  Chlorpyrifos Blow boric acid power under stoves and refrigerator. Place bait stations (hydramethylnon) at feeding sites.  Control of Mold Allergen   Mold and fungi can grow on a variety of surfaces provided certain temperature and moisture conditions exist.  Outdoor molds grow on plants, decaying vegetation and soil.  The major outdoor mold, Alternaria and Cladosporium, are found in very high numbers during hot and dry conditions.  Generally, a late Summer - Fall peak is seen for common outdoor fungal spores.  Rain will temporarily lower outdoor mold spore count, but counts rise rapidly when the rainy period ends.  The most important indoor molds are Aspergillus and Penicillium.  Dark, humid and poorly ventilated basements are ideal sites for mold growth.  The next most common sites of mold growth are the bathroom and the kitchen.  Outdoor (Seasonal) Mold Control  Use air conditioning and keep windows closed Avoid exposure to decaying vegetation. Avoid leaf raking. Avoid grain handling. Consider wearing a face mask if working in moldy areas.    Indoor (Perennial) Mold Control   Maintain humidity below 50%. Clean washable surfaces with 5% bleach solution. Remove sources e.g. contaminated carpets.

## 2022-07-14 ENCOUNTER — Telehealth: Payer: Self-pay | Admitting: *Deleted

## 2022-07-14 NOTE — Telephone Encounter (Signed)
L/M for patient to contact me to [facility] approval for fasenra pen and submit to [employer] with copay card       Ricky Monte, MD  Devoria Glassing, CMA [place]-he still coughing a lot, and has not had 2 samples of Fasenra.  Says this is only thing that controls him for a short period of time.  Any chance we could try resubmitting for him?

## 2022-07-23 ENCOUNTER — Ambulatory Visit (INDEPENDENT_AMBULATORY_CARE_PROVIDER_SITE_OTHER): Payer: 59

## 2022-07-23 DIAGNOSIS — J4550 Severe persistent asthma, uncomplicated: Secondary | ICD-10-CM

## 2022-07-23 MED ORDER — BENRALIZUMAB 30 MG/ML ~~LOC~~ SOSY
30.0000 mg | PREFILLED_SYRINGE | Freq: Once | SUBCUTANEOUS | Status: AC
  Administered 2022-07-23: 30 mg via SUBCUTANEOUS

## 2022-07-23 NOTE — Progress Notes (Signed)
Patient started Fasenra 210 mg/1.91 mL. He was shown how and where to give it to himself to continue giving it at home every 4 weeks x 3, then every 8 weeks. He verbalized understanding and had no  problems.

## 2022-12-09 ENCOUNTER — Ambulatory Visit: Payer: 59 | Admitting: Internal Medicine

## 2022-12-09 ENCOUNTER — Encounter: Payer: Self-pay | Admitting: Internal Medicine

## 2022-12-09 VITALS — BP 150/90 | HR 82 | Temp 98.8°F | Resp 18 | Ht 71.0 in | Wt 306.0 lb

## 2022-12-09 DIAGNOSIS — J3089 Other allergic rhinitis: Secondary | ICD-10-CM

## 2022-12-09 DIAGNOSIS — R053 Chronic cough: Secondary | ICD-10-CM

## 2022-12-09 DIAGNOSIS — J4540 Moderate persistent asthma, uncomplicated: Secondary | ICD-10-CM | POA: Diagnosis not present

## 2022-12-09 DIAGNOSIS — K219 Gastro-esophageal reflux disease without esophagitis: Secondary | ICD-10-CM | POA: Diagnosis not present

## 2022-12-09 DIAGNOSIS — J302 Other seasonal allergic rhinitis: Secondary | ICD-10-CM

## 2022-12-09 MED ORDER — FLUTICASONE PROPIONATE 50 MCG/ACT NA SUSP: NASAL | 1 refills | Status: DC

## 2022-12-09 MED ORDER — MONTELUKAST SODIUM 10 MG PO TABS: ORAL_TABLET | ORAL | 1 refills | Status: DC

## 2022-12-09 MED ORDER — OMEPRAZOLE 40 MG PO CPDR: 40.0000 mg | DELAYED_RELEASE_CAPSULE | Freq: Two times a day (BID) | ORAL | 1 refills | Status: DC

## 2022-12-09 MED ORDER — LEVOCETIRIZINE DIHYDROCHLORIDE 5 MG PO TABS: ORAL_TABLET | ORAL | 2 refills | Status: DC

## 2022-12-09 MED ORDER — SYMBICORT 160-4.5 MCG/ACT IN AERO: 2.0000 | INHALATION_SPRAY | Freq: Two times a day (BID) | RESPIRATORY_TRACT | 1 refills | Status: DC

## 2022-12-09 MED ORDER — ALBUTEROL SULFATE HFA 108 (90 BASE) MCG/ACT IN AERS: INHALATION_SPRAY | RESPIRATORY_TRACT | 2 refills | Status: DC

## 2022-12-09 NOTE — Progress Notes (Signed)
Follow Up Note  RE: Ricky Gutierrez MRN: [id number] DOB: [birth year] Date of Office Visit: 12/09/2022  Referring provider: Glendon Axe, MD Primary care provider: Glendon Axe, MD  Chief Complaint: Asthma and Follow-up (Pt is good health, pt is present today due to job change and upcoming insurance changes, so he wanted to get 90 days refill on all med's)  History of Present Illness: I had the pleasure of seeing Ricky Gutierrez for a follow up visit at the [facility] on 12/09/2022. He is a 52 y.o. male, who is being followed for chronic cough, GERD, rhinitis. His previous allergy office visit was on 07/09/22 with Dr. Simona Huh. Today is a regular follow up visit.  History obtained from patient, chart review.  At last visit Symbicort was decreased to 1 puff twice daily.  He was continued on his rhinitis and reflux medications.  Berna Bue was resubmitted and his third injection on 07/23/2022.    Reports excellent response to [place].  Cough is pretty much resolved.  He does need a refill of his Symbicort and albuterol.  Taking Symbicort 1 puff twice daily.  Not requiring any of his albuterol.  No OCS or antibiotics since last visit.  His insurance is changing due to change in job he plans to reach out to [place] in regards to coverage for [place].  GERD is well-controlled with omeprazole 40 mg twice a day.  He has not needed his Ryaltris please continue Xyzal 5 mg daily with good control of upper airway symptoms.  No side effects of medication or changes to health history.  Pertinent History/Diagnostics:  - Chronic cough: seen in 2018 by Dr. Ernst Bowler.  At that time complaining of a cough, suspected to be uncontrolled asthma.  He was given a sample of Fasenra.  He was noted to have wheezing on exam which improved with albuterol.  His cough reportedly resolved following 1 injection of Fasenra. Cough restarted fall 2022. Covid 19 12/2021; no improvement with multiple  z-packs. Has reflux, takes omperazole 40 mg daily which was increased to twice daily in March 2023. This improved cough some. We gave another sample of Fasenra March 2023-denied by insurance. Cough suspected to be mostly UACS.                -  previously also followed by Dr. Melvyn Novas.  Has been prescribed gabapentin 100 mg 3 times a day in the past.. No longer taking, unclear if helpful.                - seeing Pulmonologist at [facility] medical-prescribed steroids and Symbicort 160, unclear if helping            - AEC WBC 8.1 at 7% = 567 on 10/29/21 in [place] Records -CXR: 11/21/21: No abnormality identified.                - 01/19/21 spirometry - nonobstructive pattern ratio 103%, possible restriction  with out bronchodilator response   - Allergic Rhinitis:                 - SPT environmental panel 2018 skin allergy testing was positive to trees, weeds, grasses, molds, dust mites, cat, dog, cockroach.                - -11/26/21-positive to grasses (Johnson, perennial rye, Timothy) trees (white alder, birch, Box Elder, cedar, Sycamore, elm, mulberry, oak, pecan), weeds (ragweed, sagebrush), molds (Alternaria penicillium) mixed feathers, cattle, dog, horse, mouse  Today he reports  Berna Bue was approved.  He has completed his first 3 injections and is due for 3rd injection in February.   Significant improvement improvement in cough since starting.  He has run out of symbicort and also needs a refill of albuterol.    Assessment and Plan: Ricky Gutierrez is a 52 y.o. male with: Chronic cough  Moderate persistent asthma without complication - Plan: Spirometry with Graph  Seasonal and perennial allergic rhinitis  Gastroesophageal reflux disease, unspecified whether esophagitis present   Plan: Patient Instructions  Chronic cough:  controlled  Suspect multiple factors contributing including Reflux, Environmental Allergies, and lower airway hyper responsiveness  Lower airway component:-  your lung testing today looked okay  Continue fasenra 30mg  subcu every 8 weeks  -call [name] first for insurance issues, if any issues with changing insurance we can try to bridge with samples - Controller Med: continue Symbicort 160 mcg, 1 puffs twice a day. - continue Singulair 10 mg nightly - Rescue Inhaler:  Albuterol: (Ventolin) 2 to 4 puffs . Use  every 4-6 hours as needed for chest tigHtness, wheezing, or coughing.  Can also use 15 minutes prior to exercise if you have symptoms with activity. - Cough is not controlled if:  - Symptoms are occurring >2 times a week OR  - >2 times a month nighttime awakenings  - You are requiring systemic steroids (prednisone/steroid injections) more than once per year  - Your require hospitalization for your asthma.  - Please call the clinic to schedule a follow up if these symptoms arise   . Reflux component: continue omeprazole 40 mg twice a day, take 20 minutes prior to your meals  - allergic rhinitis component:   - xyzal 5 mg daiy  - ryaltris 1-2 sprays in each nostril twice daily  - allergen avoidance  Follow up: 6 months   Thank you so much for letting me partake in your care today.  Don't hesitate to reach out if you have any additional concerns!  [name], MD  [facility] [place] [place]    Meds ordered this encounter  Medications   albuterol (VENTOLIN HFA) 108 (90 Base) MCG/ACT inhaler    Sig: Use 2 to 4 puffs every 4-6 hours as needed for cough, shortness of breath or wheezing    Dispense:  1 each    Refill:  2   fluticasone (FLONASE) 50 MCG/ACT nasal spray    Sig: 2 sprays per nostril once daily as needed for stuffy nose    Dispense:  48 g    Refill:  1    Please dispense 90 day supply   levocetirizine (XYZAL) 5 MG tablet    Sig: 1 tablet by mouth once daily for itchy eyes.    Dispense:  90 tablet    Refill:  2   montelukast (SINGULAIR) 10 MG tablet    Sig: One tablet once daily at night for coughing  or wheezing.    Dispense:  90 tablet    Refill:  1    Please dispense 90 day supply   omeprazole (PRILOSEC) 40 MG capsule    Sig: Take 1 capsule (40 mg total) by mouth in the morning and at bedtime. Take 30 minutes prior to any meals.    Dispense:  180 capsule    Refill:  1    Please dispense 90 day supply   SYMBICORT 160-4.5 MCG/ACT inhaler    Sig: Inhale 2 puffs into the lungs 2 (two) times  daily.    Dispense:  3 each    Refill:  1    Please dispense 90 day supply    Lab Orders  No laboratory test(s) ordered today   Diagnostics: Spirometry:  Tracings reviewed. His effort: Good reproducible efforts. FVC: 3.11 L FEV1: 2.59 L, 77% predicted FEV1/FVC ratio: 83% Interpretation: Spirometry consistent with normal pattern.  Please see scanned spirometry results for details.     Medication List:  Current Outpatient Medications  Medication Sig Dispense Refill   CELEBREX 200 MG capsule Take 200 mg by mouth 2 (two) times daily.     famotidine (PEPCID) 40 MG tablet Take by mouth.     FASENRA PEN 30 MG/ML SOAJ Inject into the skin.     MAGNESIUM CITRATE PO Take by mouth.     Magnesium Citrate POWD Take by mouth.     tiZANidine (ZANAFLEX) 2 MG tablet Take 4 mg by mouth 2 (two) times daily.     albuterol (VENTOLIN HFA) 108 (90 Base) MCG/ACT inhaler Use 2 to 4 puffs every 4-6 hours as needed for cough, shortness of breath or wheezing 1 each 2   fluticasone (FLONASE) 50 MCG/ACT nasal spray 2 sprays per nostril once daily as needed for stuffy nose 48 g 1   levocetirizine (XYZAL) 5 MG tablet 1 tablet by mouth once daily for itchy eyes. 90 tablet 2   montelukast (SINGULAIR) 10 MG tablet One tablet once daily at night for coughing or wheezing. 90 tablet 1   omeprazole (PRILOSEC) 40 MG capsule Take 1 capsule (40 mg total) by mouth in the morning and at bedtime. Take 30 minutes prior to any meals. 180 capsule 1   SYMBICORT 160-4.5 MCG/ACT inhaler Inhale 2 puffs into the lungs 2 (two) times  daily. 3 each 1   No current facility-administered medications for this visit.   Allergies: Allergies  Allergen Reactions   Fish Allergy    Codeine Nausea Only   I reviewed his past medical history, social history, family history, and environmental history and no significant changes have been reported from his previous visit.  ROS: All others negative except as noted per HPI.   Objective: BP (!) 150/90   Pulse 82   Temp 98.8 F (37.1 C) (Temporal)   Resp 18   Ht 5\' 11"  (1.803 m)   Wt (!) 306 lb (138.8 kg)   SpO2 97%   BMI 42.68 kg/m  Body mass index is 42.68 kg/m. General Appearance:  Alert, cooperative, no distress, appears stated age  Head:  Normocephalic, without obvious abnormality, atraumatic  Eyes:  Conjunctiva clear, EOM's intact  Nose: Nares normal, normal mucosa, no visible anterior polyps, and septum midline  Throat: Lips, tongue normal; teeth and gums normal, normal posterior oropharynx  Neck: Supple, symmetrical  Lungs:   clear to auscultation bilaterally, Respirations unlabored, no coughing  Heart:  regular rate and rhythm and no murmur, Appears well perfused  Extremities: No edema  Skin: Skin color, texture, turgor normal, no rashes or lesions on visualized portions of skin  Neurologic: No gross deficits   Previous notes and tests were reviewed. The plan was reviewed with the patient/family, and all questions/concerned were addressed.  It was my pleasure to see Yuvin today and participate in his care. Please feel free to contact me with any questions or concerns.  Sincerely,  [name], MD  Allergy & Immunology  [facility] Office: [phone]

## 2022-12-09 NOTE — Patient Instructions (Addendum)
Chronic cough:  controlled  Suspect multiple factors contributing including Reflux, Environmental Allergies, and lower airway hyper responsiveness  Lower airway component:- your lung testing today looked okay  Continue fasenra 30mg  subcu every 8 weeks  -call [name] first for insurance issues, if any issues with changing insurance we can try to bridge with samples - Controller Med: continue Symbicort 160 mcg, 1 puffs twice a day. - continue Singulair 10 mg nightly - Rescue Inhaler:  Albuterol: (Ventolin) 2 to 4 puffs . Use  every 4-6 hours as needed for chest tigHtness, wheezing, or coughing.  Can also use 15 minutes prior to exercise if you have symptoms with activity. - Cough is not controlled if:  - Symptoms are occurring >2 times a week OR  - >2 times a month nighttime awakenings  - You are requiring systemic steroids (prednisone/steroid injections) more than once per year  - Your require hospitalization for your asthma.  - Please call the clinic to schedule a follow up if these symptoms arise   . Reflux component: continue omeprazole 40 mg twice a day, take 20 minutes prior to your meals  - allergic rhinitis component:   - xyzal 5 mg daiy  - ryaltris 1-2 sprays in each nostril twice daily  - allergen avoidance  Follow up: 6 months   Thank you so much for letting me partake in your care today.  Don't hesitate to reach out if you have any additional concerns!  [name], MD  [facility] [place], [place]

## 2023-02-26 ENCOUNTER — Ambulatory Visit (INDEPENDENT_AMBULATORY_CARE_PROVIDER_SITE_OTHER): Payer: Self-pay

## 2023-02-26 DIAGNOSIS — J4550 Severe persistent asthma, uncomplicated: Secondary | ICD-10-CM

## 2023-02-26 MED ORDER — BENRALIZUMAB 30 MG/ML ~~LOC~~ SOSY
30.0000 mg | PREFILLED_SYRINGE | Freq: Once | SUBCUTANEOUS | Status: AC
  Administered 2023-02-26: 30 mg via SUBCUTANEOUS

## 2023-04-02 ENCOUNTER — Telehealth: Payer: Self-pay | Admitting: Internal Medicine

## 2023-04-02 NOTE — Telephone Encounter (Signed)
Left message for patient to call back. Need most recent insurance information for [employer].

## 2023-04-21 ENCOUNTER — Other Ambulatory Visit (HOSPITAL_COMMUNITY): Payer: Self-pay

## 2023-04-21 ENCOUNTER — Telehealth: Payer: Self-pay | Admitting: *Deleted

## 2023-04-21 MED ORDER — FASENRA PEN 30 MG/ML ~~LOC~~ SOAJ
30.0000 mg | SUBCUTANEOUS | 6 refills | Status: DC
  Filled 2023-04-21: qty 1, 56d supply, fill #0

## 2023-04-21 NOTE — Telephone Encounter (Signed)
Patient advised of new approval with Ins and submit to [facility] to fill same

## 2023-04-21 NOTE — Telephone Encounter (Signed)
L/m for patient change in pharmacy with new Ins coverage to [facility]

## 2023-04-22 ENCOUNTER — Other Ambulatory Visit: Payer: Self-pay

## 2023-04-22 NOTE — Telephone Encounter (Signed)
Called and advised patient Ricky Gutierrez cannot precess Rx will be sending to [facility]

## 2023-11-09 ENCOUNTER — Encounter: Payer: Self-pay | Admitting: Internal Medicine

## 2023-11-09 ENCOUNTER — Ambulatory Visit (INDEPENDENT_AMBULATORY_CARE_PROVIDER_SITE_OTHER): Payer: BC Managed Care – PPO | Admitting: Internal Medicine

## 2023-11-09 VITALS — BP 138/86 | HR 80 | Temp 97.2°F | Resp 18

## 2023-11-09 DIAGNOSIS — J4550 Severe persistent asthma, uncomplicated: Secondary | ICD-10-CM | POA: Diagnosis not present

## 2023-11-09 DIAGNOSIS — J3089 Other allergic rhinitis: Secondary | ICD-10-CM

## 2023-11-09 DIAGNOSIS — J302 Other seasonal allergic rhinitis: Secondary | ICD-10-CM | POA: Diagnosis not present

## 2023-11-09 DIAGNOSIS — K219 Gastro-esophageal reflux disease without esophagitis: Secondary | ICD-10-CM | POA: Diagnosis not present

## 2023-11-09 MED ORDER — METHYLPREDNISOLONE ACETATE 40 MG/ML IJ SUSP
40.0000 mg | Freq: Once | INTRAMUSCULAR | Status: AC
  Administered 2023-11-09: 40 mg via INTRAMUSCULAR

## 2023-11-09 NOTE — Progress Notes (Signed)
 Follow Up Note  RE: Ricky Gutierrez MRN: [id number] DOB: 08-31-1971 Date of Office Visit: 11/09/2023  Referring provider: Claudene Round, MD Primary care provider: Claudene Round, MD  Chief Complaint: No chief complaint on file.  History of Present Illness: I had the pleasure of seeing Ricky Gutierrez for a follow up visit at [facility] on 11/09/2023. He is a 53 y.o. male, who is being followed for chronic cough, GERD, rhinitis. His previous allergy  office visit was on 2/7/24with Dr. Marinda. Today is a regular follow up visit.  History obtained from patient, chart review.  Ricky Gutierrez, a patient with a history of asthma and gastroesophageal reflux disease (GERD), presents with a recent lapse in medication due to a change in job and insurance.  Previously his upper and lower airway symptoms were very well controlled with his regimen.  The only current issue is need for refills due to lapse in insurance.  He reports running out of his albuterol  and nearing the end of his Symbicort  supply. He also missed his last Fasenra  injection two weeks ago, which he was receiving every eight weeks. Since missing the Fasenra  dose, he has noticed an increase in coughing. He continues to take omeprazole  for his GERD, which he reports is effective in controlling his symptoms. However, he has run out of his Xyzal  and Ryaltris, medications for his allergies. He denies any new medications or symptoms related to his GERD.  Denies any adverse effects of medications to his previous regimen.   Pertinent History/Diagnostics:  - Chronic cough: seen in 2018 by Dr. Iva.  At that time complaining of a cough, suspected to be uncontrolled asthma.  He was given a sample of Fasenra .  He was noted to have wheezing on exam which improved with albuterol .  His cough reportedly resolved following 1 injection of Fasenra . Cough restarted fall 2022. Covid 19 12/2021; no improvement with multiple  z-packs. Has reflux, takes omperazole 40 mg daily which was increased to twice daily in March 2023. This improved cough some. We gave another sample of Fasenra  March 2023-denied by insurance. Cough suspected to be mostly UACS.                -  previously also followed by Dr. Darlean.  Has been prescribed gabapentin  100 mg 3 times a day in the past.. No longer taking, unclear if helpful.                - seeing Pulmonologist at [facility] medical-prescribed steroids and Symbicort  160, unclear if helping            - AEC WBC 8.1 at 7% = 567 on 10/29/21 in [place] Medical Records -CXR: 11/21/21: No abnormality identified.                - 01/19/21 spirometry - nonobstructive pattern ratio 103%, possible restriction  with out bronchodilator response   - Allergic Rhinitis:                 - SPT environmental panel 2018 skin allergy  testing was positive to trees, weeds, grasses, molds, dust mites, cat, dog, cockroach.                - -11/26/21-positive to grasses (Johnson, perennial rye, Timothy) trees (white alder, birch, Box Elder, cedar, Sycamore, elm, mulberry, oak, pecan), weeds (ragweed, sagebrush), molds (Alternaria penicillium) mixed feathers, cattle, dog, horse, mouse  - Comorbid GERD       Assessment and Plan:  Ricky Gutierrez is a 53 y.o. male with: Not well controlled severe persistent asthma  Seasonal and perennial allergic rhinitis  Gastroesophageal reflux disease, unspecified whether esophagitis present   Plan: Patient Instructions  Chronic cough:  not well controlled  Suspect multiple factors contributing including Reflux, Environmental Allergies, and lower airway hyper responsiveness  Lower airway component:- your lung testing today looked worse than before  Given wheezing on exam: Depo Medrol  40mg  IM given today    RESTART fasenra  30mg  subcu every 8 weeks  -I have emailed [name] in regards to coverage, [name]'s number  office is [phone] - Controller Med: continue Symbicort  160  mcg, 1 puffs twice a day. - continue Singulair  10 mg nightly - Rescue Inhaler:  Albuterol : (Ventolin ) 2 to 4 puffs . Use  every 4-6 hours as needed for chest tigHtness, wheezing, or coughing.  Can also use 15 minutes prior to exercise if you have symptoms with activity. - Cough is not controlled if:  - Symptoms are occurring >2 times a week OR  - >2 times a month nighttime awakenings  - You are requiring systemic steroids (prednisone /steroid injections) more than once per year  - Your require hospitalization for your asthma.  - Please call the clinic to schedule a follow up if these symptoms arise   . Reflux component: continue omeprazole  40 mg twice a day, take 20 minutes prior to your meals  - allergic rhinitis component: moderately controlled   - Restart xyzal  5 mg daiy  - Restart ryaltris 1-2 sprays in each nostril twice daily   -sample given today in clinic   - allergen avoidance  Follow up: 3 months   Thank you so much for letting me partake in your care today.  Don't hesitate to reach out if you have any additional concerns!  Ricky Springer, MD  [facility] [place], [place]   No orders of the defined types were placed in this encounter.   Lab Orders  No laboratory test(s) ordered today   Diagnostics: Spirometry:  Tracings reviewed. His effort: Good reproducible efforts. FVC: 2.79 L FEV1: 2.34 L, 70% predicted FEV1/FVC ratio: 84% Interpretation: Spirometry consistent with normal pattern.  Please see scanned spirometry results for details.     Medication List:  Current Outpatient Medications  Medication Sig Dispense Refill   albuterol  (VENTOLIN  HFA) 108 (90 Base) MCG/ACT inhaler Use 2 to 4 puffs every 4-6 hours as needed for cough, shortness of breath or wheezing 1 each 2   CELEBREX 200 MG capsule Take 200 mg by mouth 2 (two) times daily.     famotidine  (PEPCID ) 40 MG tablet Take by mouth.     FASENRA  PEN 30 MG/ML SOAJ Inject 1 mL (30 mg total)  into the skin every 8 (eight) weeks. 1 mL 6   fluticasone  (FLONASE ) 50 MCG/ACT nasal spray 2 sprays per nostril once daily as needed for stuffy nose 48 g 1   levocetirizine (XYZAL ) 5 MG tablet 1 tablet by mouth once daily for itchy eyes. 90 tablet 2   MAGNESIUM CITRATE PO Take by mouth.     Magnesium Citrate POWD Take by mouth.     montelukast  (SINGULAIR ) 10 MG tablet One tablet once daily at night for coughing or wheezing. 90 tablet 1   omeprazole  (PRILOSEC) 40 MG capsule Take 1 capsule (40 mg total) by mouth in the morning and at bedtime. Take 30 minutes prior to any meals. 180 capsule 1   SYMBICORT  160-4.5 MCG/ACT inhaler Inhale 2 puffs into  the lungs 2 (two) times daily. 3 each 1   tiZANidine (ZANAFLEX) 2 MG tablet Take 4 mg by mouth 2 (two) times daily.     No current facility-administered medications for this visit.   Allergies: Allergies  Allergen Reactions   Fish Allergy     Codeine Nausea Only   I reviewed his past medical history, social history, family history, and environmental history and no significant changes have been reported from his previous visit.  ROS: All others negative except as noted per HPI.   Objective: BP 138/86   Pulse 80   Temp (!) 97.2 F (36.2 C) (Temporal)   Resp 18   SpO2 97%  There is no height or weight on file to calculate BMI. General Appearance:  Alert, cooperative, no distress, appears stated age  Head:  Normocephalic, without obvious abnormality, atraumatic  Eyes:  Conjunctiva clear, EOM's intact  Nose: Nares normal, normal mucosa, no visible anterior polyps, and septum midline  Throat: Lips, tongue normal; teeth and gums normal, normal posterior oropharynx  Neck: Supple, symmetrical  Lungs:   Faint expiratory wheezing in RUL  , Respirations unlabored, no coughing  Heart:  regular rate and rhythm and no murmur, Appears well perfused  Extremities: No edema  Skin: Skin color, texture, turgor normal, no rashes or lesions on visualized  portions of skin  Neurologic: No gross deficits   Previous notes and tests were reviewed. The plan was reviewed with the patient/family, and all questions/concerned were addressed.  It was my pleasure to see Darrian today and participate in his care. Please feel free to contact me with any questions or concerns.  Sincerely,  Ricky Springer, MD  Allergy  & Immunology  [facility] [place]  [place] Office: [phone]

## 2023-11-09 NOTE — Patient Instructions (Addendum)
 Chronic cough:  not well controlled  Suspect multiple factors contributing including Reflux, Environmental Allergies, and lower airway hyper responsiveness  Lower airway component:- your lung testing today looked worse than before  Given wheezing on exam: Depo Medrol  40mg  IM given today    RESTART fasenra  30mg  subcu every 8 weeks  -I have emailed [name] in regards to coverage, [name]'s number  office is [phone] - Controller Med: continue Symbicort  160 mcg, 1 puffs twice a day. - continue Singulair  10 mg nightly - Rescue Inhaler:  Albuterol : (Ventolin ) 2 to 4 puffs . Use  every 4-6 hours as needed for chest tigHtness, wheezing, or coughing.  Can also use 15 minutes prior to exercise if you have symptoms with activity. - Cough is not controlled if:  - Symptoms are occurring >2 times a week OR  - >2 times a month nighttime awakenings  - You are requiring systemic steroids (prednisone /steroid injections) more than once per year  - Your require hospitalization for your asthma.  - Please call the clinic to schedule a follow up if these symptoms arise   . Reflux component: continue omeprazole  40 mg twice a day, take 20 minutes prior to your meals  - allergic rhinitis component: moderately controlled   - Restart xyzal  5 mg daiy  - Restart ryaltris 1-2 sprays in each nostril twice daily   -sample given today in clinic   - allergen avoidance  Follow up: 3 months   Thank you so much for letting me partake in your care today.  Don't hesitate to reach out if you have any additional concerns!  Ricky Springer, MD  [facility] [place], [place]

## 2023-11-10 MED ORDER — SYMBICORT 160-4.5 MCG/ACT IN AERO: 2.0000 | INHALATION_SPRAY | Freq: Two times a day (BID) | RESPIRATORY_TRACT | 1 refills | Status: DC

## 2023-11-10 MED ORDER — MONTELUKAST SODIUM 10 MG PO TABS: ORAL_TABLET | ORAL | 1 refills | Status: DC

## 2023-11-10 MED ORDER — AZELASTINE HCL 0.1 % NA SOLN: 1.0000 | Freq: Two times a day (BID) | NASAL | 12 refills | Status: DC

## 2023-11-10 MED ORDER — ALBUTEROL SULFATE HFA 108 (90 BASE) MCG/ACT IN AERS: INHALATION_SPRAY | RESPIRATORY_TRACT | 2 refills | Status: DC

## 2023-11-10 MED ORDER — OMEPRAZOLE 40 MG PO CPDR: 40.0000 mg | DELAYED_RELEASE_CAPSULE | Freq: Two times a day (BID) | ORAL | 1 refills | Status: DC

## 2023-11-17 ENCOUNTER — Telehealth: Payer: Self-pay

## 2023-11-17 NOTE — Telephone Encounter (Signed)
 [name], Anasia from [facility] needs a new authorization on patient's new insurance card. The new insurance card we do have on file, but hasn't been sent, Anasia said  they have [facility] pt's old number on file, but not the new one. so will have to unroll the patient until the new bcbsnc card with the new number is sent. Thanks.

## 2023-11-26 NOTE — Telephone Encounter (Signed)
Found new card and pa started

## 2023-11-29 ENCOUNTER — Telehealth: Payer: Self-pay | Admitting: *Deleted

## 2023-11-29 NOTE — Telephone Encounter (Signed)
-----   Message from Ferol Luz sent at 11/09/2023  4:48 PM EST ----- Cristal Deer switched jobs and change in insurance, and his fasenra coverage lapsed.  Last dose 2 weeks ago. What do we need to do to get it covered again?  Thanks!

## 2023-11-29 NOTE — Telephone Encounter (Signed)
L/m for patient to contact me to advise approval and submit to [facility] due to his Ins change for [employer]

## 2023-11-30 NOTE — Telephone Encounter (Signed)
Patient advised of submit

## 2023-12-06 ENCOUNTER — Other Ambulatory Visit: Payer: Self-pay

## 2023-12-06 ENCOUNTER — Other Ambulatory Visit (HOSPITAL_COMMUNITY): Payer: Self-pay

## 2023-12-06 MED ORDER — FASENRA PEN 30 MG/ML ~~LOC~~ SOAJ
30.0000 mg | SUBCUTANEOUS | 6 refills | Status: AC
  Filled 2023-12-09: qty 1, 56d supply, fill #0
  Filled 2024-02-03: qty 1, 56d supply, fill #1
  Filled 2024-03-22: qty 1, 56d supply, fill #2
  Filled 2024-05-15: qty 1, 56d supply, fill #3
  Filled 2024-07-19: qty 1, 56d supply, fill #4
  Filled 2024-09-07: qty 1, 56d supply, fill #5
  Filled 2024-11-10: qty 1, 56d supply, fill #6

## 2023-12-06 NOTE — Addendum Note (Signed)
Addended by: Devoria Glassing on: 12/06/2023 01:31 PM   Modules accepted: Orders

## 2023-12-06 NOTE — Telephone Encounter (Signed)
L/m for patient [employer] has assigned his Harrington Challenger to be filled at [facility] and Rx sent to them

## 2023-12-07 ENCOUNTER — Other Ambulatory Visit (HOSPITAL_COMMUNITY): Payer: Self-pay

## 2023-12-09 ENCOUNTER — Other Ambulatory Visit: Payer: Self-pay

## 2023-12-09 ENCOUNTER — Other Ambulatory Visit (HOSPITAL_COMMUNITY): Payer: Self-pay

## 2023-12-09 NOTE — Progress Notes (Signed)
 Specialty Pharmacy Initial Fill Coordination Note  Ricky Gutierrez is a 53 y.o. male contacted today regarding initial fill of specialty medication(s) Benralizumab  (Fasenra  Pen)   Patient requested Delivery   Delivery date: 12/21/23   Verified address: [address] [place] [address]   Medication will be filled on 12/09/2023 .   Patient is aware of $0.00 copayment.

## 2023-12-09 NOTE — Progress Notes (Signed)
 Specialty Pharmacy Initiation Note   Ricky Gutierrez is a 53 y.o. male who will be followed by the specialty pharmacy service for RxSp Asthma/COPD    Review of administration, indication, effectiveness, safety, potential side effects, storage/disposable, and missed dose instructions occurred today for patient's specialty medication(s) Benralizumab  (Fasenra  Pen)     Patient/Caregiver did not have any additional questions or concerns.   Patient's therapy is appropriate to: Continue (patient already maintained on therapy, transferring pharmacies)    Goals Addressed             This Visit's Progress    Reduce disease symptoms including coughing and shortness of breath       Patient is on track. Patient will maintain adherence. Patient previously maintained on therapy with good results but was delayed on last dose due to approval and transfer of pharmacies. He is currently experiencing symptoms and will restart treatment once shipment received.         Franchelle Foskett M Shiara Mcgough Specialty Pharmacist

## 2024-01-04 ENCOUNTER — Other Ambulatory Visit (HOSPITAL_COMMUNITY): Payer: Self-pay

## 2024-01-25 ENCOUNTER — Other Ambulatory Visit: Payer: Self-pay

## 2024-01-28 ENCOUNTER — Other Ambulatory Visit: Payer: Self-pay

## 2024-01-31 ENCOUNTER — Other Ambulatory Visit (HOSPITAL_COMMUNITY): Payer: Self-pay

## 2024-02-03 ENCOUNTER — Other Ambulatory Visit: Payer: Self-pay

## 2024-02-03 NOTE — Progress Notes (Signed)
 Specialty Pharmacy Refill Coordination Note  Ricky Gutierrez is a 53 y.o. male contacted today regarding refills of specialty medication(s) Benralizumab [facility] Pen)   Patient requested Delivery   Delivery date: 02/04/24   Verified address: [address] [place] [address]   Medication will be filled on 02/03/24.

## 2024-03-01 ENCOUNTER — Emergency Department (HOSPITAL_BASED_OUTPATIENT_CLINIC_OR_DEPARTMENT_OTHER): Payer: Self-pay

## 2024-03-01 ENCOUNTER — Encounter (HOSPITAL_BASED_OUTPATIENT_CLINIC_OR_DEPARTMENT_OTHER): Payer: Self-pay

## 2024-03-01 ENCOUNTER — Emergency Department (HOSPITAL_BASED_OUTPATIENT_CLINIC_OR_DEPARTMENT_OTHER)
Admission: EM | Admit: 2024-03-01 | Discharge: 2024-03-02 | Disposition: A | Discharge status: Home / Self Care | Payer: Self-pay | Attending: Emergency Medicine | Admitting: Emergency Medicine

## 2024-03-01 ENCOUNTER — Other Ambulatory Visit: Payer: Self-pay

## 2024-03-01 DIAGNOSIS — W19XXXA Unspecified fall, initial encounter: Secondary | ICD-10-CM

## 2024-03-01 DIAGNOSIS — Z96641 Presence of right artificial hip joint: Secondary | ICD-10-CM | POA: Insufficient documentation

## 2024-03-01 DIAGNOSIS — T63001A Toxic effect of unspecified snake venom, accidental (unintentional), initial encounter: Secondary | ICD-10-CM | POA: Insufficient documentation

## 2024-03-01 DIAGNOSIS — Z23 Encounter for immunization: Secondary | ICD-10-CM | POA: Diagnosis not present

## 2024-03-01 DIAGNOSIS — W5911XA Bitten by nonvenomous snake, initial encounter: Secondary | ICD-10-CM

## 2024-03-01 MED ORDER — TETANUS-DIPHTH-ACELL PERTUSSIS 5-2.5-18.5 LF-MCG/0.5 IM SUSY
0.5000 mL | PREFILLED_SYRINGE | Freq: Once | INTRAMUSCULAR | Status: AC
  Administered 2024-03-01: 0.5 mL via INTRAMUSCULAR
  Filled 2024-03-01: qty 0.5

## 2024-03-01 NOTE — ED Triage Notes (Signed)
 Pt reports coming in after falling when running away from a snake. 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 Pain also in right shoulder, left knee, and right ankle. Pt states he may have been bit in posterior right ankle. No bite marks or wounds noted. Pt had EMS come evaluate who told him to come in to be on the safe side. Pt reports that one of the snakes he saw was a copperhead.

## 2024-03-01 NOTE — ED Triage Notes (Signed)
 Pt denies hitting head, (-) LOC, (-) thinners.

## 2024-03-01 NOTE — ED Notes (Addendum)
 Writer called poison control and was given the following recommendations: wash with soap/water, make sure patient has updated tetanus shot, observe for 8 hrs from ER or home, keep extremity elevated, no tourniquets, watch for increased swelling/pain. Benadryl and epinephrine  if anaphylactic reaction.

## 2024-03-02 LAB — CBC WITH DIFFERENTIAL/PLATELET
Abs Immature Granulocytes: 0.04 10*3/uL (ref 0.00–0.07)
Basophils Absolute: 0 10*3/uL (ref 0.0–0.1)
Basophils Relative: 0 %
Eosinophils Absolute: 0 10*3/uL (ref 0.0–0.5)
Eosinophils Relative: 0 %
HCT: 41.7 % (ref 39.0–52.0)
Hemoglobin: 14.3 g/dL (ref 13.0–17.0)
Immature Granulocytes: 0 %
Lymphocytes Relative: 26 %
Lymphs Abs: 2.4 10*3/uL (ref 0.7–4.0)
MCH: 31.1 pg (ref 26.0–34.0)
MCHC: 34.3 g/dL (ref 30.0–36.0)
MCV: 90.7 fL (ref 80.0–100.0)
Monocytes Absolute: 0.8 10*3/uL (ref 0.1–1.0)
Monocytes Relative: 9 %
Neutro Abs: 6 10*3/uL (ref 1.7–7.7)
Neutrophils Relative %: 65 %
Platelets: 234 10*3/uL (ref 150–400)
RBC: 4.6 MIL/uL (ref 4.22–5.81)
RDW: 13 % (ref 11.5–15.5)
WBC: 9.3 10*3/uL (ref 4.0–10.5)
nRBC: 0 % (ref 0.0–0.2)

## 2024-03-02 LAB — BASIC METABOLIC PANEL WITH GFR
Anion gap: 13 (ref 5–15)
BUN: 15 mg/dL (ref 6–20)
CO2: 21 mmol/L — ABNORMAL LOW (ref 22–32)
Calcium: 9.8 mg/dL (ref 8.9–10.3)
Chloride: 105 mmol/L (ref 98–111)
Creatinine, Ser: 1.02 mg/dL (ref 0.61–1.24)
GFR, Estimated: >60 mL/min (ref >60)
Glucose, Bld: 99 mg/dL (ref 70–99)
Potassium: 4.2 mmol/L (ref 3.5–5.1)
Sodium: 139 mmol/L (ref 135–145)

## 2024-03-02 LAB — FIBRINOGEN: Fibrinogen: 322 mg/dL (ref 210–475)

## 2024-03-02 LAB — PROTIME-INR
INR: 1 (ref 0.8–1.2)
Prothrombin Time: 13.7 s (ref 11.4–15.2)

## 2024-03-02 NOTE — ED Notes (Signed)
 Spoke with Ricky Gutierrez at Poison control who informed me the incident was originally reported at the time of triage. I informed him there appears to be a shallow puncture wound to the right posterior ankle. There is slight swelling to the RLE, but no signs of discoloration or streaking from puncture wounds. Per Ernestina Headland, Rph 8 hrs observation and measure out circumference from proximal wound and two other distal sites on leg q2h until 8hrs post initial bite (2015).

## 2024-03-02 NOTE — ED Notes (Signed)
 Right Leg Measurements  R ankle: 28cm R calf: 48cm R distal thigh: 55cm

## 2024-03-02 NOTE — ED Notes (Signed)
 Right Leg Measurements  R Ankle: 28.75cm R Calf: 47.5cm R Distal thigh: 54.5cm

## 2024-03-02 NOTE — ED Notes (Signed)
 Right Leg Measurements   R ankle: 28cm R calf: 47.5cm R distal thigh: 55cm  8 hr observation window ended at this time. No change to color, temperature, or pain to affected extremity.

## 2024-03-02 NOTE — ED Provider Notes (Signed)
 [facility] Provider Note   CSN: [id number] Arrival date & time: 03/01/24  2124     History  Chief Complaint  Patient presents with   Snake Bite   Fall    Ricky Gutierrez is a 53 y.o. male.  The history is provided by the patient.  Fall This is a new problem. The current episode started 1 to 2 hours ago. The problem occurs rarely. The problem has been resolved. Pertinent negatives include no chest pain, no abdominal pain, no headaches and no shortness of breath. Nothing aggravates the symptoms. Nothing relieves the symptoms. He has tried nothing for the symptoms. The treatment provided no relief.  Bite by a snake, was by the trash and was trying to run and fell.       Home Medications Prior to Admission medications   Medication Sig Start Date End Date Taking? Authorizing Provider  albuterol  (VENTOLIN  HFA) 108 (90 Base) MCG/ACT inhaler Use 2 to 4 puffs every 4-6 hours as needed for cough, shortness of breath or wheezing 11/10/23   Orelia Binet, MD  azelastine  (ASTELIN ) 0.1 % nasal spray Place 1 spray into both nostrils 2 (two) times daily. Use in each nostril as directed 11/10/23   Orelia Binet, MD  CELEBREX 200 MG capsule Take 200 mg by mouth 2 (two) times daily. 10/21/22   [provider]  famotidine  (PEPCID ) 40 MG tablet Take by mouth. 10/14/21   [provider]  FASENRA  PEN 30 MG/ML prefilled autoinjector Inject 1 mL (30 mg total) into the skin every 8 (eight) weeks. 12/06/23   Sean Czar, MD  fluticasone  (FLONASE ) 50 MCG/ACT nasal spray 2 sprays per nostril once daily as needed for stuffy nose 12/09/22   Orelia Binet, MD  levocetirizine (XYZAL ) 5 MG tablet 1 tablet by mouth once daily for itchy eyes. 12/09/22   Orelia Binet, MD  MAGNESIUM CITRATE PO Take by mouth.    [provider]  Magnesium Citrate POWD Take by mouth.    [provider]  montelukast  (SINGULAIR ) 10 MG tablet  One tablet once daily at night for coughing or wheezing. 11/10/23   Orelia Binet, MD  omeprazole  (PRILOSEC) 40 MG capsule Take 1 capsule (40 mg total) by mouth in the morning and at bedtime. Take 30 minutes prior to any meals. 11/10/23   Orelia Binet, MD  SYMBICORT  160-4.5 MCG/ACT inhaler Inhale 2 puffs into the lungs 2 (two) times daily. 11/10/23   Orelia Binet, MD  tiZANidine (ZANAFLEX) 2 MG tablet Take 4 mg by mouth 2 (two) times daily. 10/21/22   [provider]      Allergies    Fish allergy  and Codeine    Review of Systems   Review of Systems  Respiratory:  Negative for shortness of breath.   Cardiovascular:  Positive for leg swelling. Negative for chest pain.  Gastrointestinal:  Negative for abdominal pain.  Neurological:  Negative for headaches.  All other systems reviewed and are negative.   Physical Exam Updated Vital Signs BP (!) 156/90 (BP Location: Right Arm)   Pulse 76   Temp 97.9 F (36.6 C) (Oral)   Resp 18   Ht 5\' 11"  (1.803 m)   Wt (!) 142.9 kg   SpO2 96%   BMI 43.93 kg/m  Physical Exam Vitals and nursing note reviewed.  Constitutional:      General: He is not in acute distress.    Appearance: Normal appearance. He is well-developed. He  is not diaphoretic.  HENT:     Head: Normocephalic and atraumatic.  Eyes:     Conjunctiva/sclera: Conjunctivae normal.     Pupils: Pupils are equal, round, and reactive to light.  Cardiovascular:     Rate and Rhythm: Normal rate and regular rhythm.  Pulmonary:     Effort: Pulmonary effort is normal.     Breath sounds: Normal breath sounds. No wheezing or rales.  Abdominal:     General: Bowel sounds are normal.     Palpations: Abdomen is soft.     Tenderness: There is no abdominal tenderness. There is no guarding or rebound.  Musculoskeletal:        General: Normal range of motion.     Cervical back: Normal range of motion and neck supple.     Right lower leg: No deformity, lacerations, tenderness or  bony tenderness.     Right ankle:     Right Achilles Tendon: Normal.     Right foot: Normal range of motion and normal capillary refill. No deformity, foot drop, laceration, bony tenderness or crepitus. Normal pulse.       Legs:     Comments: No ecchymosis of the RLE, all compartments soft on initial exam and at discharge.  Cap refill < 2 sec. FROM of the RLE at discharge.    Skin:    General: Skin is warm and dry.     Capillary Refill: Capillary refill takes less than 2 seconds.  Neurological:     General: No focal deficit present.     Mental Status: He is alert and oriented to person, place, and time.     ED Results / Procedures / Treatments   Labs (all labs ordered are listed, but only abnormal results are displayed) Results for orders placed or performed during the hospital encounter of 03/01/24  CBC WITH DIFFERENTIAL   Collection Time: 03/02/24 12:58 AM  Result Value Ref Range   WBC 9.3 4.0 - 10.5 K/uL   RBC 4.60 4.22 - 5.81 MIL/uL   Hemoglobin 14.3 13.0 - 17.0 g/dL   HCT 16.1 09.6 - 04.5 %   MCV 90.7 80.0 - 100.0 fL   MCH 31.1 26.0 - 34.0 pg   MCHC 34.3 30.0 - 36.0 g/dL   RDW 40.9 81.1 - 91.4 %   Platelets 234 150 - 400 K/uL   nRBC 0.0 0.0 - 0.2 %   Neutrophils Relative % 65 %   Neutro Abs 6.0 1.7 - 7.7 K/uL   Lymphocytes Relative 26 %   Lymphs Abs 2.4 0.7 - 4.0 K/uL   Monocytes Relative 9 %   Monocytes Absolute 0.8 0.1 - 1.0 K/uL   Eosinophils Relative 0 %   Eosinophils Absolute 0.0 0.0 - 0.5 K/uL   Basophils Relative 0 %   Basophils Absolute 0.0 0.0 - 0.1 K/uL   Immature Granulocytes 0 %   Abs Immature Granulocytes 0.04 0.00 - 0.07 K/uL  Protime-INR   Collection Time: 03/02/24 12:58 AM  Result Value Ref Range   Prothrombin Time 13.7 11.4 - 15.2 seconds   INR 1.0 0.8 - 1.2  Fibrinogen    Collection Time: 03/02/24 12:58 AM  Result Value Ref Range   Fibrinogen  322 210 - 475 mg/dL  Basic metabolic panel   Collection Time: 03/02/24 12:58 AM  Result Value Ref  Range   Sodium 139 135 - 145 mmol/L   Potassium 4.2 3.5 - 5.1 mmol/L   Chloride 105 98 - 111 mmol/L   CO2  21 (L) 22 - 32 mmol/L   Glucose, Bld 99 70 - 99 mg/dL   BUN 15 6 - 20 mg/dL   Creatinine, Ser 9.14 0.61 - 1.24 mg/dL   Calcium 9.8 8.9 - 78.2 mg/dL   GFR, Estimated >95 >62 mL/min   Anion gap 13 5 - 15   DG Hip Unilat W or Wo Pelvis 2-3 Views Right Result Date: 03/01/2024 CLINICAL DATA:  Fall, pain. EXAM: DG HIP (WITH OR WITHOUT PELVIS) 3V RIGHT COMPARISON:  None Available. FINDINGS: Status post right total hip arthroplasty. No periprosthetic lucency. Normal alignment. Pelvic ring is intact. There is degenerative change at the left hip with sclerosis, osteophytes and joint space narrowing. IMPRESSION: Status post right total hip arthroplasty. Degenerative changes on the left. Electronically Signed   By: Sydell Eva M.D.   On: 03/01/2024 22:36   DG Ankle Complete Right Result Date: 03/01/2024 CLINICAL DATA:  fall, pain EXAM: RIGHT ANKLE - COMPLETE 3 VIEW COMPARISON:  None Available. FINDINGS: There is no evidence of fracture, dislocation, or subluxation. There is no evidence of arthropathy or other focal bone abnormality. Soft tissue swelling noted at the ankle mortise. IMPRESSION: Soft tissue swelling. No acute osseous abnormality identified. Electronically Signed   By: Sydell Eva M.D.   On: 03/01/2024 22:35   DG Shoulder Right Result Date: 03/01/2024 CLINICAL DATA:  Fall, pain. EXAM: RIGHT SHOULDER - 3 VIEW COMPARISON:  None Available. FINDINGS: There is no evidence of fracture or dislocation. Acromioclavicular degenerative change with narrowing and osteophyte formation. IMPRESSION: Acromioclavicular degenerative changes. No acute osseous abnormalities. Electronically Signed   By: Sydell Eva M.D.   On: 03/01/2024 22:35     Radiology DG Hip Unilat W or Wo Pelvis 2-3 Views Right Result Date: 03/01/2024 CLINICAL DATA:  Fall, pain. EXAM: DG HIP (WITH OR WITHOUT PELVIS) 3V  RIGHT COMPARISON:  None Available. FINDINGS: Status post right total hip arthroplasty. No periprosthetic lucency. Normal alignment. Pelvic ring is intact. There is degenerative change at the left hip with sclerosis, osteophytes and joint space narrowing. IMPRESSION: Status post right total hip arthroplasty. Degenerative changes on the left. Electronically Signed   By: Sydell Eva M.D.   On: 03/01/2024 22:36   DG Ankle Complete Right Result Date: 03/01/2024 CLINICAL DATA:  fall, pain EXAM: RIGHT ANKLE - COMPLETE 3 VIEW COMPARISON:  None Available. FINDINGS: There is no evidence of fracture, dislocation, or subluxation. There is no evidence of arthropathy or other focal bone abnormality. Soft tissue swelling noted at the ankle mortise. IMPRESSION: Soft tissue swelling. No acute osseous abnormality identified. Electronically Signed   By: Sydell Eva M.D.   On: 03/01/2024 22:35   DG Shoulder Right Result Date: 03/01/2024 CLINICAL DATA:  Fall, pain. EXAM: RIGHT SHOULDER - 3 VIEW COMPARISON:  None Available. FINDINGS: There is no evidence of fracture or dislocation. Acromioclavicular degenerative change with narrowing and osteophyte formation. IMPRESSION: Acromioclavicular degenerative changes. No acute osseous abnormalities. Electronically Signed   By: Sydell Eva M.D.   On: 03/01/2024 22:35    Procedures Procedures    Medications Ordered in ED Medications  Tdap (BOOSTRIX ) injection 0.5 mL (0.5 mLs Intramuscular Given 03/01/24 2344)    ED Course/ Medical Decision Making/ A&P                                 Medical Decision Making Patient fell while trying to get away from a snake saw it coiled by  trash   Amount and/or Complexity of Data Reviewed Independent Historian: spouse    Details: See above  External Data Reviewed: notes.    Details: Previous notes reviewed  Labs: ordered.    Details: Normal sodium 139, normal potassium 4.2, normal creatinine.  Normal white count 9.3,  normal hemoglobin 14.3, normal platelets.  Normal coagulation studies.  Normal fibrinogen  322  Radiology: ordered and independent interpretation performed.    Details: No fractures on Xrays   Risk Prescription drug management. Risk Details: Poison control contacted upon arrival.  Labs ordered and patient observed for a full 8 hours.  No changes in skin or compartments.  Labs are unremarkable.  There is no indication for Crofab.  Tetanus was updated.  Stable for discharge.  Strict returns.      Final Clinical Impression(s) / ED Diagnoses Final diagnoses:  Snake bite, initial encounter  Fall, initial encounter    No signs of systemic illness or infection. The patient is nontoxic-appearing on exam and vital signs are within normal limits.  I have reviewed the triage vital signs and the nursing notes. Pertinent labs & imaging results that were available during my care of the patient were reviewed by me and considered in my medical decision making (see chart for details). After history, exam, and medical workup I feel the patient has been appropriately medically screened and is safe for discharge home. Pertinent diagnoses were discussed with the patient. Patient was given return precautions.      Rx / DC Orders ED Discharge Orders     None         Pariss Hommes, MD 03/02/24 [phone]

## 2024-03-02 NOTE — ED Notes (Signed)
 Right Leg Measurements  R ankle: 28.5cm R calf: 47cm R distal thigh: 55.5cm

## 2024-03-02 NOTE — ED Notes (Signed)
 Right Leg Measurements  R ankle: 28.5cm R calf: 47.5cm R distal thigh: 55cm

## 2024-03-20 ENCOUNTER — Other Ambulatory Visit: Payer: Self-pay

## 2024-03-22 ENCOUNTER — Other Ambulatory Visit: Payer: Self-pay

## 2024-03-22 ENCOUNTER — Other Ambulatory Visit: Payer: Self-pay | Admitting: Pharmacy Technician

## 2024-03-22 NOTE — Progress Notes (Signed)
 Specialty Pharmacy Refill Coordination Note  Ricky Gutierrez is a 53 y.o. male contacted today regarding refills of specialty medication(s) Benralizumab  (Fasenra  Pen)   Patient requested Delivery   Delivery date: 03/30/24   Verified address: [address] [place]   Medication will be filled on 03/29/24.

## 2024-05-11 ENCOUNTER — Other Ambulatory Visit: Payer: Self-pay

## 2024-05-13 ENCOUNTER — Other Ambulatory Visit: Payer: Self-pay | Admitting: Internal Medicine

## 2024-05-15 ENCOUNTER — Other Ambulatory Visit: Payer: Self-pay

## 2024-05-15 ENCOUNTER — Other Ambulatory Visit (HOSPITAL_COMMUNITY): Payer: Self-pay

## 2024-05-15 NOTE — Progress Notes (Signed)
 Specialty Pharmacy Refill Coordination Note  Ricky Gutierrez is a 53 y.o. male contacted today regarding refills of specialty medication(s) Benralizumab  (Fasenra  Pen)   Patient requested Delivery   Delivery date: 05/30/24   Verified address: [address] [place]   Medication will be filled on 05/29/24.

## 2024-05-15 NOTE — Progress Notes (Signed)
 Specialty Pharmacy Ongoing Clinical Assessment Note  Ricky Gutierrez is a 53 y.o. male who is being followed by the specialty pharmacy service for RxSp Asthma/COPD   Patient's specialty medication(s) reviewed today: Benralizumab  (Fasenra  Pen)   Missed doses in the last 4 weeks: 0   Patient/Caregiver did not have any additional questions or concerns.   Therapeutic benefit summary: Patient is achieving benefit   Adverse events/side effects summary: No adverse events/side effects   Patient's therapy is appropriate to: Continue    Goals Addressed             This Visit's Progress    Reduce disease symptoms including coughing and shortness of breath   On track    Patient is on track. Patient will maintain adherence. Patient reports that he is well-controlled at this time.        Follow up: 12 months  Silvano LOISE Dolly Specialty Pharmacist

## 2024-05-29 ENCOUNTER — Other Ambulatory Visit: Payer: Self-pay

## 2024-07-17 ENCOUNTER — Other Ambulatory Visit (HOSPITAL_COMMUNITY): Payer: Self-pay

## 2024-07-19 ENCOUNTER — Other Ambulatory Visit (HOSPITAL_COMMUNITY): Payer: Self-pay

## 2024-07-19 ENCOUNTER — Other Ambulatory Visit: Payer: Self-pay

## 2024-07-19 NOTE — Progress Notes (Signed)
 Specialty Pharmacy Refill Coordination Note  Ricky Gutierrez is a 53 y.o. male contacted today regarding refills of specialty medication(s) Benralizumab  (Fasenra  Pen)   Patient requested Delivery   Delivery date: 07/21/24   Verified address: [address] [place]   Medication will be filled on 07/20/24.

## 2024-09-07 ENCOUNTER — Other Ambulatory Visit: Payer: Self-pay

## 2024-09-11 ENCOUNTER — Other Ambulatory Visit: Payer: Self-pay

## 2024-09-13 ENCOUNTER — Other Ambulatory Visit: Payer: Self-pay

## 2024-09-13 NOTE — Progress Notes (Signed)
 Specialty Pharmacy Refill Coordination Note  Ricky Gutierrez is a 53 y.o. male contacted today regarding refills of specialty medication(s) Benralizumab  (Fasenra  Pen)   Patient requested Delivery   Delivery date: 09/26/24   Verified address: [address] [place]   Medication will be filled on: 09/25/24

## 2024-09-14 ENCOUNTER — Other Ambulatory Visit: Payer: Self-pay

## 2024-09-14 ENCOUNTER — Telehealth: Payer: Self-pay

## 2024-09-14 ENCOUNTER — Ambulatory Visit: Admitting: Family

## 2024-09-14 ENCOUNTER — Encounter: Payer: Self-pay | Admitting: Family

## 2024-09-14 ENCOUNTER — Ambulatory Visit: Admitting: Internal Medicine

## 2024-09-14 VITALS — BP 132/88 | HR 95 | Temp 97.7°F | Resp 18 | Ht 71.0 in | Wt 330.6 lb

## 2024-09-14 DIAGNOSIS — K219 Gastro-esophageal reflux disease without esophagitis: Secondary | ICD-10-CM | POA: Diagnosis not present

## 2024-09-14 DIAGNOSIS — J4551 Severe persistent asthma with (acute) exacerbation: Secondary | ICD-10-CM

## 2024-09-14 DIAGNOSIS — J3089 Other allergic rhinitis: Secondary | ICD-10-CM

## 2024-09-14 DIAGNOSIS — J302 Other seasonal allergic rhinitis: Secondary | ICD-10-CM

## 2024-09-14 MED ORDER — SYMBICORT 160-4.5 MCG/ACT IN AERO: INHALATION_SPRAY | RESPIRATORY_TRACT | 1 refills | Status: AC

## 2024-09-14 MED ORDER — PREDNISONE 10 MG PO TABS
ORAL_TABLET | ORAL | 0 refills | Status: AC
Start: 2024-09-14 — End: ?

## 2024-09-14 MED ORDER — LEVOCETIRIZINE DIHYDROCHLORIDE 5 MG PO TABS: ORAL_TABLET | ORAL | 1 refills | Status: AC

## 2024-09-14 MED ORDER — AZELASTINE HCL 0.1 % NA SOLN: NASAL | 5 refills | Status: AC

## 2024-09-14 MED ORDER — ALBUTEROL SULFATE HFA 108 (90 BASE) MCG/ACT IN AERS: INHALATION_SPRAY | RESPIRATORY_TRACT | 1 refills | Status: AC

## 2024-09-14 MED ORDER — FLUTICASONE PROPIONATE 50 MCG/ACT NA SUSP: NASAL | 1 refills | Status: AC

## 2024-09-14 NOTE — Telephone Encounter (Signed)
 Thanks

## 2024-09-14 NOTE — Telephone Encounter (Signed)
 Tried pharmacist, hospital on [facility] sent to voice mail x 2 [phone]. Per Leontine they are at lunch till 2 pm

## 2024-09-14 NOTE — Patient Instructions (Addendum)
 Chronic cough:   Start prednisone  10 mg taking 2 tablets twice a day for 4 days, then on the 5th day take 2 tablets in the morning and stop Suspect multiple factors contributing including Reflux, Environmental Allergies, and lower airway hyper responsiveness  Lower airway component:- your lung testing today looked good but you had wheezing through out    continue fasenra  30mg  subcu  every 8 weeks at home   - Controller Med: INCREASE Symbicort  160 mcg, 2 puffs twice a day. Rinse mouth out after. Make sure and take this medication every day. Set reminders on your phone or set your inhaler next to your toothbrush - continue Singulair  10 mg nightly - Rescue Inhaler: Albuterol : (Ventolin ) 2 to 4 puffs. Use  every 4-6 hours as needed for chest tigHtness, wheezing, or coughing.  Can also use 15 minutes prior to exercise if you have symptoms with activity. - Cough is not controlled if:  - Symptoms are occurring >2 times a week OR  - >2 times a month nighttime awakenings  - You are requiring systemic steroids (prednisone /steroid injections) more than once per year  - Your require hospitalization for your asthma.  - Please call the clinic to schedule a follow up if these symptoms arise   . Reflux component: continue omeprazole  40 mg twice a day, take 20 minutes prior to your meals  - allergic rhinitis component: moderately controlled   - Restart xyzal  5 mg daiy  - Restart ryaltris 1-2 sprays in each nostril twice daily   -sample given today in clinic   - allergen avoidance  Follow up: 6-8 weeks or sooner if needed  Thank you so much for letting me partake in your care today.  Don't hesitate to reach out if you have any additional concerns!

## 2024-09-14 NOTE — Progress Notes (Signed)
 [address] [place] [address] Dept: [phone]  FOLLOW UP NOTE  Patient ID: Ricky Gutierrez, male    DOB: 10-31-1971  Age: 53 y.o. MRN: [id number] Date of Office Visit: 09/14/2024  Assessment  Chief Complaint: Follow-up  HPI Ricky Gutierrez is a 53 year old male who presents today for follow-up of not well-controlled severe persistent asthma, seasonal and perennial  allergic rhinitis, and gastroesophageal reflux disease.  He was last seen on November 09, 2023 by Dr. Lorin.  He did not follow-up in 3 months as recommended.  Since his last office visit he reports that he was bit by a copperhead snake, but he reports that it was a dry bite and did not require antivenom.  Asthma: He is currently taking Symbicort  160/4.5 mcg 2 puffs once a day.  He typically does this 3 to 4 days a week.  He is better about taking his Symbicort  during the beginning of the week rather than the end.  He does however mention that he has used his Symbicort  every day this week.  He also continues to receive Fasenra  injections every 8 weeks at home.  He denies any problems or reactions with his Fasenra .  He does also wonder whether if the Fasenra  is as effective as it previously was.  His next dose is due 1 December.  He reports coughing off and on especially around smells, perfumes, and spicy foods.  He has been wheezing a lot lately.  He denies tightness in his chest, fever, chills, shortness of breath, and nocturnal awakenings due to breathing problems.  He has had 1 round of steroids in August for plantar fasciitis after having the snakebite.  He has uses his albuterol  approximately once a week.  He last used it Saturday when he cleaned out his garage.  It was helpful.  He does continue to take Singulair  10 mg daily.  Reflux is reported as pretty good.  He continues to take omeprazole  40 mg twice a day.  Allergic rhinitis: He has not used Xyzal  in a while, but needs a refill.  He has not used  Ryaltris nasal spray and he needs a refill.  He reports rhinorrhea and nasal congestion in the morning but it does not last long.  He also have postnasal drip earlier, but not recently.  He has not been treated for any sinus infections since we last saw him.   Drug Allergies:  Allergies  Allergen Reactions   Fish Allergy     Codeine Nausea Only    Review of Systems: Negative except as per HPI   Physical Exam: BP 132/88   Pulse 95   Temp 97.7 F (36.5 C) (Temporal)   Resp 18   Ht 5' 11 (1.803 m)   Wt (!) 330 lb 9.6 oz (150 kg)   SpO2 95%   BMI 46.11 kg/m    Physical Exam Constitutional:      Appearance: Normal appearance.  HENT:     Head: Normocephalic and atraumatic.     Right Ear: Tympanic membrane, ear canal and external ear normal.     Left Ear: Tympanic membrane, ear canal and external ear normal.     Ears:     Comments: Pharynx normal, eyes normal, ears normal, nose normal    Nose: Nose normal.     Mouth/Throat:     Mouth: Mucous membranes are moist.     Pharynx: Oropharynx is clear.  Eyes:     Conjunctiva/sclera: Conjunctivae normal.  Cardiovascular:  Rate and Rhythm: Regular rhythm.     Heart sounds: Normal heart sounds.  Pulmonary:     Effort: Pulmonary effort is normal.     Comments: Wheezing heard throughout all lung fields Musculoskeletal:     Cervical back: Neck supple.  Skin:    General: Skin is warm.  Neurological:     Mental Status: He is alert and oriented to person, place, and time.  Psychiatric:        Mood and Affect: Mood normal.        Behavior: Behavior normal.        Thought Content: Thought content normal.        Judgment: Judgment normal.     Diagnostics: FVC 3.23 L (78%), FEV1 2.76 L (86%), FEV1/FVC 0.85.  Spirometry indicates normal spirometry.  Assessment and Plan: 1. Severe persistent asthma with (acute) exacerbation (HCC)   2. Seasonal and perennial allergic rhinitis   3. Gastroesophageal reflux disease, unspecified  whether esophagitis present     Meds ordered this encounter  Medications   albuterol  (VENTOLIN  HFA) 108 (90 Base) MCG/ACT inhaler    Sig: Use 2 to 4 puffs every 4-6 hours as needed for cough, shortness of breath or wheezing    Dispense:  1 each    Refill:  1   SYMBICORT  160-4.5 MCG/ACT inhaler    Sig: Inhale 2 puffs twice a day with spacer to help prevent cough and wheeze.  Rinse mouth out afterwards    Dispense:  3 each    Refill:  1    Please dispense 90 day supply   predniSONE  (DELTASONE ) 10 MG tablet    Sig: Take 2 tablets twice a day for 4 days, then on the fifth day take 2 tablets in the morning and stop    Dispense:  18 tablet    Refill:  0   levocetirizine (XYZAL ) 5 MG tablet    Sig: Take 1 tablet by mouth once daily as needed    Dispense:  90 tablet    Refill:  1   azelastine  (ASTELIN ) 0.1 % nasal spray    Sig: Place 1 to 2 sprays in each nostril once or twice a day as needed for runny nose/drainage down throat    Dispense:  30 mL    Refill:  5   fluticasone  (FLONASE ) 50 MCG/ACT nasal spray    Sig: 2 sprays per nostril once daily as needed for stuffy nose    Dispense:  48 g    Refill:  1    Please dispense 90 day supply    Patient Instructions  Chronic cough:   Start prednisone  10 mg taking 2 tablets twice a day for 4 days, then on the 5th day take 2 tablets in the morning and stop Suspect multiple factors contributing including Reflux, Environmental Allergies, and lower airway hyper responsiveness  Lower airway component:- your lung testing today looked good but you had wheezing through out    continue fasenra  30mg  subcu  every 8 weeks at home   - Controller Med: INCREASE Symbicort  160 mcg, 2 puffs twice a day. Rinse mouth out after. Make sure and take this medication every day. Set reminders on your phone or set your inhaler next to your toothbrush - continue Singulair  10 mg nightly - Rescue Inhaler: Albuterol : (Ventolin ) 2 to 4 puffs. Use  every 4-6 hours as  needed for chest tigHtness, wheezing, or coughing.  Can also use 15 minutes prior to exercise if you have symptoms  with activity. - Cough is not controlled if:  - Symptoms are occurring >2 times a week OR  - >2 times a month nighttime awakenings  - You are requiring systemic steroids (prednisone /steroid injections) more than once per year  - Your require hospitalization for your asthma.  - Please call the clinic to schedule a follow up if these symptoms arise   . Reflux component: continue omeprazole  40 mg twice a day, take 20 minutes prior to your meals  - allergic rhinitis component: moderately controlled   - Restart xyzal  5 mg daiy  - Restart ryaltris 1-2 sprays in each nostril twice daily   -sample given today in clinic   - allergen avoidance  Follow up: 6-8 weeks or sooner if needed  Thank you so much for letting me partake in your care today.  Don't hesitate to reach out if you have any additional concerns!    Return in about 6 weeks (around 10/26/2024), or if symptoms worsen or fail to improve.    Thank you for the opportunity to care for this patient.  Please do not hesitate to contact me with questions.  Wanda Craze, FNP [facility] 

## 2024-11-09 ENCOUNTER — Ambulatory Visit: Admitting: Family

## 2024-11-10 ENCOUNTER — Other Ambulatory Visit: Payer: Self-pay

## 2024-11-11 ENCOUNTER — Other Ambulatory Visit: Payer: Self-pay | Admitting: Internal Medicine

## 2024-11-13 ENCOUNTER — Other Ambulatory Visit (HOSPITAL_COMMUNITY): Payer: Self-pay

## 2024-11-13 ENCOUNTER — Other Ambulatory Visit: Payer: Self-pay

## 2024-11-13 NOTE — Progress Notes (Signed)
 Specialty Pharmacy Refill Coordination Note  MyChart Questionnaire Submission  Ricky Gutierrez is a 54 y.o. male contacted today regarding refills of specialty medication(s) Fasenra .  Doses on hand: 0   Next inj: 12/03/24  Patient requested: Delivery  Delivery date: 11/15/24  Verified address: [address] [place] [address]  Medication will be filled on 11/14/24

## 2024-11-14 ENCOUNTER — Other Ambulatory Visit: Payer: Self-pay

## 2024-11-16 NOTE — Patient Instructions (Incomplete)
 Chronic cough:   Suspect multiple factors contributing including Reflux, Environmental Allergies, and lower airway hyper responsiveness  Lower airway component:- your lung testing today looked good but you had wheezing through out    continue fasenra  30mg  subcu  every 8 weeks at home   - Controller Med: Continue Symbicort  160 mcg, 2 puffs twice a day. Rinse mouth out after. Make sure and take this medication every day. Set reminders on your phone or set your inhaler next to your toothbrush - continue Singulair  10 mg nightly - Rescue Inhaler: Albuterol : (Ventolin ) 2 to 4 puffs. Use  every 4-6 hours as needed for chest tigHtness, wheezing, or coughing.  Can also use 15 minutes prior to exercise if you have symptoms with activity. - Cough is not controlled if:  - Symptoms are occurring >2 times a week OR  - >2 times a month nighttime awakenings  - You are requiring systemic steroids (prednisone /steroid injections) more than once per year  - Your require hospitalization for your asthma.  - Please call the clinic to schedule a follow up if these symptoms arise   . Reflux component: continue omeprazole  40 mg twice a day, take 20 minutes prior to your meals  - allergic rhinitis component:   - continue  xyzal  5 mg daiy  - continue ryaltris 1-2 sprays in each nostril twice daily    - allergen avoidance  Follow up: months or sooner if needed  Thank you so much for letting me partake in your care today.  Don't hesitate to reach out if you have any additional concerns!

## 2024-11-17 ENCOUNTER — Ambulatory Visit: Admitting: Family

## 2024-11-17 ENCOUNTER — Encounter: Payer: Self-pay | Admitting: Family

## 2024-11-17 ENCOUNTER — Other Ambulatory Visit: Payer: Self-pay

## 2024-11-17 VITALS — BP 128/86 | HR 86 | Temp 97.9°F | Resp 20 | Wt 332.5 lb

## 2024-11-17 DIAGNOSIS — J3089 Other allergic rhinitis: Secondary | ICD-10-CM | POA: Diagnosis not present

## 2024-11-17 DIAGNOSIS — J302 Other seasonal allergic rhinitis: Secondary | ICD-10-CM

## 2024-11-17 DIAGNOSIS — J4550 Severe persistent asthma, uncomplicated: Secondary | ICD-10-CM | POA: Diagnosis not present

## 2024-11-17 DIAGNOSIS — K219 Gastro-esophageal reflux disease without esophagitis: Secondary | ICD-10-CM

## 2024-11-17 NOTE — Progress Notes (Signed)
 "  [address] [place] [address] Dept: [phone]  FOLLOW UP NOTE  Patient ID: Ricky Gutierrez, male    DOB: September 18, 1971  Age: 54 y.o. MRN: [id number] Date of Office Visit: 11/17/2024  Assessment  Chief Complaint: Asthma (2 month follow up doing well refill montelukast  using symbicort  as directed with increase during illness)  HPI [name] is a 54 year old male who presents today for follow-up of severe persistent asthma with acute exacerbation, seasonal and perennial allergic rhinitis, and gastroesophageal reflux disease.  He was last seen by myself on September 14, 2024.  He denies any new diagnosis or surgeries since her last office visit.  Severe persistent asthma: He reports that his asthma has been good.  He denies cough, wheeze, tightness in chest, shortness of breath, and nocturnal awakenings due to breathing problems.  Since his last office visit he has not required any more steroids than the ones I prescribed at his last office visit and has not made any trips to the emergency room or urgent care due to breathing problems.  He continues to receive Fasenra  injections per protocol at home without any problems or reactions.  He does feel like his Fasenra  helps.  He is taking Symbicort  160/4.5 mcg 2 puffs twice a day every day and takes Singulair  10 mg at night.  He does not use his albuterol  too often.  Reflux is reported as good as long as he takes his omeprazole  40 mg once a day.  Allergic rhinitis: He continues to take Xyzal  5 mg daily and uses Ryaltris nasal spray daily also.  He denies rhinorrhea, nasal congestion, and postnasal drip.  He has not been treated for any sinus infections since we last saw him.   Drug Allergies:  Allergies[1]  Review of Systems: Negative except as per HPI   Physical Exam: BP 128/86   Pulse 86   Temp 97.9 F (36.6 C) (Temporal)   Resp 20   Wt (!) 332 lb 8 oz (150.8 kg)   SpO2 94%   BMI 46.37 kg/m    Physical  Exam Constitutional:      Appearance: Normal appearance.  HENT:     Head: Normocephalic and atraumatic.     Comments: Pharynx normal, eyes normal, ears normal, nose: Bilateral lower turbinates mildly edematous with no drainage noted    Right Ear: Tympanic membrane, ear canal and external ear normal.     Left Ear: Tympanic membrane, ear canal and external ear normal.     Mouth/Throat:     Mouth: Mucous membranes are moist.     Pharynx: Oropharynx is clear.  Eyes:     Conjunctiva/sclera: Conjunctivae normal.  Cardiovascular:     Rate and Rhythm: Regular rhythm.     Heart sounds: Normal heart sounds.  Pulmonary:     Effort: Pulmonary effort is normal.     Breath sounds: Normal breath sounds.     Comments: Lungs clear to auscultation Musculoskeletal:     Cervical back: Neck supple.  Skin:    General: Skin is warm.  Neurological:     Mental Status: He is alert and oriented to person, place, and time.  Psychiatric:        Mood and Affect: Mood normal.        Behavior: Behavior normal.        Thought Content: Thought content normal.        Judgment: Judgment normal.     Diagnostics:  None  Assessment and  Plan: 1. Severe persistent asthma, unspecified whether complicated (HCC)   2. Seasonal and perennial allergic rhinitis   3. Gastroesophageal reflux disease, unspecified whether esophagitis present     No orders of the defined types were placed in this encounter.   Patient Instructions  Chronic cough:   Suspect multiple factors contributing including Reflux, Environmental Allergies, and lower airway hyper responsiveness  Lower airway component:- your lung testing today looked good but you had wheezing through out    continue fasenra  30mg  subcu  every 8 weeks at home   - Controller Med: Continue Symbicort  160 mcg, 2 puffs twice a day. Rinse mouth out after. Make sure and take this medication every day. Set reminders on your phone or set your inhaler next to your  toothbrush - continue Singulair  10 mg nightly - Rescue Inhaler: Albuterol : (Ventolin ) 2 to 4 puffs. Use  every 4-6 hours as needed for chest tigHtness, wheezing, or coughing.  Can also use 15 minutes prior to exercise if you have symptoms with activity. - Cough is not controlled if:  - Symptoms are occurring >2 times a week OR  - >2 times a month nighttime awakenings  - You are requiring systemic steroids (prednisone /steroid injections) more than once per year  - Your require hospitalization for your asthma.  - Please call the clinic to schedule a follow up if these symptoms arise   . Reflux component: continue omeprazole  40 mg once a day, take 20 minutes prior to your meals  - allergic rhinitis component:   - continue  xyzal  5 mg daiy  - continue ryaltris 1-2 sprays in each nostril twice daily    - allergen avoidance  Follow up:3-4 months or sooner if needed  Thank you so much for letting me partake in your care today.  Don't hesitate to reach out if you have any additional concerns!   Return in about 3 months (around 02/15/2025), or if symptoms worsen or fail to improve.    Thank you for the opportunity to care for this patient.  Please do not hesitate to contact me with questions.  Wanda Craze, FNP [facility]         [1]  Allergies Allergen Reactions   Fish Allergy     Codeine Nausea Only   "

## 2024-11-30 ENCOUNTER — Other Ambulatory Visit: Payer: Self-pay | Admitting: Internal Medicine

## 2025-03-22 ENCOUNTER — Ambulatory Visit: Admitting: Family
